# Patient Record
Sex: Female | Born: 1935 | Race: Black or African American
Health system: Southern US, Community
[De-identification: ages and names within clinical notes are randomized; demographics above are authoritative.]

## PROBLEM LIST (undated history)

## (undated) DIAGNOSIS — J302 Other seasonal allergic rhinitis: Secondary | ICD-10-CM

## (undated) DIAGNOSIS — R0902 Hypoxemia: Secondary | ICD-10-CM

## (undated) DIAGNOSIS — K219 Gastro-esophageal reflux disease without esophagitis: Secondary | ICD-10-CM

## (undated) DIAGNOSIS — I739 Peripheral vascular disease, unspecified: Secondary | ICD-10-CM

## (undated) DIAGNOSIS — I2510 Atherosclerotic heart disease of native coronary artery without angina pectoris: Secondary | ICD-10-CM

## (undated) DIAGNOSIS — I219 Acute myocardial infarction, unspecified: Secondary | ICD-10-CM

## (undated) DIAGNOSIS — Z87898 Personal history of other specified conditions: Secondary | ICD-10-CM

## (undated) DIAGNOSIS — E785 Hyperlipidemia, unspecified: Secondary | ICD-10-CM

## (undated) DIAGNOSIS — R0600 Dyspnea, unspecified: Secondary | ICD-10-CM

## (undated) DIAGNOSIS — D649 Anemia, unspecified: Secondary | ICD-10-CM

## (undated) DIAGNOSIS — R918 Other nonspecific abnormal finding of lung field: Secondary | ICD-10-CM

## (undated) DIAGNOSIS — I10 Essential (primary) hypertension: Secondary | ICD-10-CM

## (undated) HISTORY — DX: Hypoxemia: R09.02

## (undated) HISTORY — PX: COLONOSCOPY: SHX174

## (undated) HISTORY — PX: VASCULAR SURGERY: SHX849

## (undated) HISTORY — PX: FOOT SURGERY: SHX648

## (undated) HISTORY — PX: CARDIAC SURGERY: SHX584

## (undated) NOTE — *Deleted (*Deleted)
Transition of Care [facility]) - CM/SW Discharge Note   Patient Details  Name: Tracey Jackson MRN: [id number] Date of Birth: 03-06-1935  Transition [facility] Care [facility]) CM/SW Contact:  Epifanio Lesches, RN Phone Number: [phone] 06/26/2020, 8:57 AM   Clinical Narrative:    Patient will DC to: home Anticipated DC date: 06/26/2020 Family notified: daughter, Luster [facility] Transport by: car  Per MD patient ready for DC today. RN, patient,and patient's daughter notified of DC. Daughter has arranged appointment for establishment of primary care, pt without PCP. DME: rolling walker , BSC will be delivered to bedside prior to d/c. Portable oxygen tank / concentrator will be delivered to bedside prior to d/c.  Pt without Rx med concerns or affordability.  Caprice Renshaw (Daughter)     [phone]       RNCM will sign off for now as intervention is no longer needed. Please consult [place] again if new needs arise.   Final next level of care: Home/Self Care Barriers to Discharge: No Barriers Identified   Patient Goals and CMS Choice Patient states their goals for this hospitalization and ongoing recovery are:: to go home CMS Medicare.gov Compare Post Acute Care list provided to:: Patient Choice offered to / list presented to : Patient  Discharge Placement                       Discharge Plan and Services   Discharge Planning Services: CM Consult Post Acute Care Choice: Durable Medical Equipment          DME Arranged: Oxygen DME Agency: [employer] Date DME Agency Contacted: 06/25/20 Time DME Agency Contacted: 1434 Representative spoke with at DME Agency: Ian Malkin waiting on sat note and MD order [facility] Arranged: NA          Social Determinants of Health (SDOH) Interventions     Readmission Risk Interventions No flowsheet data found.

---

## 2014-02-06 ENCOUNTER — Emergency Department (HOSPITAL_COMMUNITY)
Admission: EM | Admit: 2014-02-06 | Discharge: 2014-02-06 | Disposition: A | Discharge status: Home / Self Care | Payer: Self-pay | Attending: Emergency Medicine | Admitting: Emergency Medicine

## 2014-02-06 ENCOUNTER — Encounter (HOSPITAL_COMMUNITY): Payer: Self-pay | Admitting: Emergency Medicine

## 2014-02-06 DIAGNOSIS — Z7901 Long term (current) use of anticoagulants: Secondary | ICD-10-CM | POA: Insufficient documentation

## 2014-02-06 DIAGNOSIS — R58 Hemorrhage, not elsewhere classified: Secondary | ICD-10-CM

## 2014-02-06 DIAGNOSIS — Z79899 Other long term (current) drug therapy: Secondary | ICD-10-CM | POA: Insufficient documentation

## 2014-02-06 DIAGNOSIS — I10 Essential (primary) hypertension: Secondary | ICD-10-CM | POA: Insufficient documentation

## 2014-02-06 DIAGNOSIS — I2510 Atherosclerotic heart disease of native coronary artery without angina pectoris: Secondary | ICD-10-CM | POA: Insufficient documentation

## 2014-02-06 DIAGNOSIS — F17200 Nicotine dependence, unspecified, uncomplicated: Secondary | ICD-10-CM | POA: Insufficient documentation

## 2014-02-06 DIAGNOSIS — IMO0002 Reserved for concepts with insufficient information to code with codable children: Secondary | ICD-10-CM | POA: Insufficient documentation

## 2014-02-06 DIAGNOSIS — T888XXA Other specified complications of surgical and medical care, not elsewhere classified, initial encounter: Secondary | ICD-10-CM | POA: Insufficient documentation

## 2014-02-06 HISTORY — DX: Essential (primary) hypertension: I10

## 2014-02-06 HISTORY — DX: Atherosclerotic heart disease of native coronary artery without angina pectoris: I25.10

## 2014-02-06 LAB — CBC WITH DIFFERENTIAL/PLATELET
Basophils Absolute: 0 10*3/uL (ref 0.0–0.1)
Basophils Relative: 0 % (ref 0–1)
Eosinophils Absolute: 0.2 10*3/uL (ref 0.0–0.7)
Eosinophils Relative: 3 % (ref 0–5)
HCT: 30.8 % — ABNORMAL LOW (ref 36.0–46.0)
Hemoglobin: 8.9 g/dL — ABNORMAL LOW (ref 12.0–15.0)
Lymphocytes Relative: 19 % (ref 12–46)
Lymphs Abs: 1.4 10*3/uL (ref 0.7–4.0)
MCH: 27.1 pg (ref 26.0–34.0)
MCHC: 28.9 g/dL — ABNORMAL LOW (ref 30.0–36.0)
MCV: 93.6 fL (ref 78.0–100.0)
Monocytes Absolute: 0.8 10*3/uL (ref 0.1–1.0)
Monocytes Relative: 10 % (ref 3–12)
Neutro Abs: 5.3 10*3/uL (ref 1.7–7.7)
Neutrophils Relative %: 68 % (ref 43–77)
Platelets: 412 10*3/uL — ABNORMAL HIGH (ref 150–400)
RBC: 3.29 MIL/uL — ABNORMAL LOW (ref 3.87–5.11)
RDW: 19.9 % — ABNORMAL HIGH (ref 11.5–15.5)
WBC: 7.7 10*3/uL (ref 4.0–10.5)

## 2014-02-06 LAB — PROTIME-INR
INR: 1.07 (ref 0.00–1.49)
Prothrombin Time: 13.7 seconds (ref 11.6–15.2)

## 2014-02-06 MED ORDER — "THROMBI-PAD 3""X3"" EX PADS"
1.0000 | MEDICATED_PAD | Freq: Once | CUTANEOUS | Status: AC
  Administered 2014-02-06: 1 via TOPICAL
  Filled 2014-02-06: qty 1

## 2014-02-06 NOTE — ED Provider Notes (Signed)
CSN: [id number]     Arrival date & time 02/06/14  0424 History   First MD Initiated Contact with Patient 02/06/14 [phone]     Chief Complaint  Patient presents with  . Coagulation Disorder    (Consider location/radiation/quality/duration/timing/severity/associated sxs/prior Treatment) HPI Comments: Patient is a 79 year old female with a history of hypertension and coronary artery disease, on daily Lovenox injections and Plavix, who presents to the emergency department for persistent bleeding. Patient states she gave herself her Lovenox injection at 8 PM yesterday evening and the injection site to her left lower abdomen has been bleeding constantly since this time. Son states that they applied gauze and pressure without relief of symptoms. No aggravating factors identified. Patient denies associated lightheadedness or dizziness, syncope, shortness of breath, abdominal pain, numbness/tingling, and weakness.  The history is provided by the patient and a relative. No language interpreter was used.    Past Medical History  Diagnosis Date  . Hypertension   . Coronary artery disease    Past Surgical History  Procedure Laterality Date  . Cardiac surgery      Stent Placement   No family history on file. History  Substance Use Topics  . Smoking status: Current Every Day Smoker -- 0.25 packs/day    Types: Cigarettes  . Smokeless tobacco: Never Used  . Alcohol Use: No   OB History   Grav Para Term Preterm Abortions TAB SAB Ect Mult Living                  Review of Systems  Skin:       +ecchymosis  Hematological: Bruises/bleeds easily.  All other systems reviewed and are negative.    Allergies  Review of patient's allergies indicates no known allergies.  Home Medications   Prior to Admission medications   Medication Sig Start Date End Date Taking? Authorizing Provider  clopidogrel (PLAVIX) 75 MG tablet Take 75 mg by mouth daily.  01/28/14  Yes Historical Provider, MD  enoxaparin  (LOVENOX) 100 MG/ML injection Inject 100 mg into the skin daily.  02/04/14  Yes Historical Provider, MD  furosemide (LASIX) 20 MG tablet Take 20 mg by mouth daily.  01/12/14  Yes Historical Provider, MD  pantoprazole (PROTONIX) 40 MG tablet Take 40 mg by mouth daily.  12/08/13  Yes Historical Provider, MD   BP 106/72  Pulse 86  Temp(Src) 97.9 F (36.6 C) (Oral)  SpO2 92%  Physical Exam  Nursing note and vitals reviewed. Constitutional: She is oriented to person, place, and time. She appears well-developed and well-nourished. No distress.  Nontoxic/nonseptic appearing. Patient in NAD.  HENT:  Head: Normocephalic and atraumatic.  Eyes: Conjunctivae and EOM are normal. No scleral icterus.  Neck: Normal range of motion.  Cardiovascular: Normal rate, regular rhythm and intact distal pulses.   Distal radial pulse 2+ in RUE.  Pulmonary/Chest: Effort normal. No respiratory distress.  No tachypnea or dyspnea. Chest expansion symmetric.  Abdominal: Soft. She exhibits no shifting dullness, no distension, no pulsatile midline mass and no mass. There is no tenderness. There is no rebound and no guarding.    See skin comments  Musculoskeletal: Normal range of motion.  Neurological: She is alert and oriented to person, place, and time.  Skin: Skin is warm and dry. No rash noted. She is not diaphoretic. No erythema. No pallor.  +Ecchymosis to LLQ of abdomen with central punctate area of bleeding characterized to be slow and weeping. No palpable, pulsatile bleeding. No significant induration. No associated TTP.  Psychiatric: She has a normal mood and affect. Her behavior is normal.    ED Course  Procedures (including critical care time) Labs Review Labs Reviewed  CBC WITH DIFFERENTIAL - Abnormal; Notable for the following:    RBC 3.29 (*)    Hemoglobin 8.9 (*)    HCT 30.8 (*)    MCHC 28.9 (*)    RDW 19.9 (*)    Platelets 412 (*)    All other components within normal limits  PROTIME-INR     Imaging Review No results found.   EKG Interpretation None      MDM   Final diagnoses:  Bleeding  Ecchymosis    Patient is a 79 year old female on chronic anticoagulant who presents to the emergency department for prolonged bleeding from her Lovenox injection site in her left lower abdomen. Patient states that bleeding has been persistent since 8 PM yesterday. On my examination, patient has a area of ecchymosis without induration to her left lower quadrant of her abdomen. Bleeding is minimal, from a central punctate area without palpable, pulsatile bleeding. Thrombi and pressure applied in ED. Patient checked after one hour of applied pressure. Thrombi pad without any evidence of blood. No active bleeding identified.  Patient today with a hemoglobin of 8.9. No old for comparison. Patient, however, has no evidence of acute blood loss. Ecchymosis has not worsened since arrival and patient has remained hemodynamically stable without tachycardia or hypotension. She denies shortness of breath and is without hypoxia. Believe she is stable for discharge at this time with instruction to follow up with her primary care physician on Monday for a repeat CBC. Return precautions provided and discussed with son at bedside. Both are agreeable to plan with no unaddressed concerns.   Filed Vitals:   02/06/14 0437 02/06/14 0445 02/06/14 0500 02/06/14 0515  BP:  125/67 135/79 106/72  Pulse:  95 96 86  Temp: 97.9 F (36.6 C)     TempSrc: Oral     SpO2:  93% 94% 92%     Antonietta Breach, PA-C 02/06/14 [phone]

## 2014-02-06 NOTE — ED Provider Notes (Signed)
Medical screening examination/treatment/procedure(s) were performed by non-physician practitioner and as supervising physician I was immediately available for consultation/collaboration.   EKG Interpretation None       Kalman Drape, MD 02/06/14 [phone]

## 2014-02-06 NOTE — ED Notes (Addendum)
Patient arrives from home via EMS with complaint of bleeding via lovenox injection site on lower left abdomen.

## 2014-02-06 NOTE — Discharge Instructions (Signed)
Anticoagulation, Generic Anticoagulants are medications used to prevent clots from developing in your veins. These medications are also known as blood thinners. If blood clots are untreated, they could travel to your lungs. This is called a pulmonary embolus. A blood clot in your lungs can be fatal.  Caregivers often use anticoagulants to prevent clots following surgery. Anticoagulants are also used along with aspirin when the heart is not getting enough blood. Another anticoagulant called warfarin is started 2 to 3 days after a rapid-acting injectable anticoagulant is started. The rapid-acting anticoagulants are usually continued until warfarin has begun to work. Your caregiver will judge this length of time by blood tests known as the prothrombin time (PT) and International Normalization Ratio (INR). This means that your blood is at the necessary and best level to prevent clots. RISKS AND COMPLICATIONS  If you have received recent epidural anesthesia, spinal anesthesia, or a spinal tap while receiving anticoagulants, you are at risk for developing a blood clot in or around the spine. This condition could result in long-term or permanent paralysis.  Because anticoagulants thin your blood, severe bleeding may occur from any tissue or organ. Symptoms of the blood being too thin may include:  Bleeding from the nose or gums that does not stop quickly.  Unusual bruising or bruising easily.  Swelling or pain at an injection site.  A cut that does not stop bleeding within 10 minutes.  Continual nausea for more than 1 day or vomiting blood.  Coughing up blood.  Blood in the urine which may appear as pink, red, or brown urine.  Blood in bowel movements which may appear as red, dark or black stools.  Sudden weakness or numbness of the face, arm, or leg, especially on one side of the body.  Sudden confusion.  Trouble speaking (aphasia) or understanding.  Sudden trouble seeing in one or both  eyes.  Sudden trouble walking.  Dizziness.  Loss of balance or coordination.  Severe pain, such as a headache, joint pain, or back pain.  Fever.  Too little anticoagulation continues to allow the risk for blood clots. HOME CARE INSTRUCTIONS   Due to the complications of anticoagulants, it is very important that you take your anticoagulant as directed by your caregiver. Anticoagulants need to be taken exactly as instructed. Be sure you understand all your anticoagulant instructions.  Warfarin. Your caregiver will advise you on the length of treatment (usually 3 6 months, sometimes lifelong).  Take warfarin exactly as directed by your caregiver. It is recommended that you take your warfarin dose at the same time of the day. It is preferred that you take warfarin in the late afternoon. If you have been told to stop taking warfarin, do not resume taking warfarin until directed to do so by your caregiver. Follow your caregiver's instructions if you accidentally take an extra dose or miss a dose of warfarin. It is very important to take warfarin as directed since bleeding or blood clots could result in chronic or permanent injury, pain, or disability.  Too much and too little warfarin are both dangerous. Too much warfarin increases the risk of bleeding. Too little warfarin continues to allow the risk for blood clots. While taking warfarin, you will need to have regular blood tests to measure your blood clotting time. These blood tests usually include both the PT and INR tests. The PT and INR results allow your caregiver to adjust your dose of warfarin. The dose can change for many reasons. It is critically   important that you take warfarin exactly as prescribed, and that you have your PT and INR levels drawn exactly as directed. Follow up with your laboratory test appointments as directed. It is very important to keep your lab appointments. Not keeping lab appointments could result in a chronic or  permanent injury, pain, or disability.  Many foods, especially foods high in vitamin K can interfere with warfarin and affect the PT and INR results. Foods high in vitamin K include spinach, kale, broccoli, cabbage, collard and turnip greens, brussels sprouts, peas, cauliflower, seaweed, and parsley as well as beef and pork liver, green tea, and soybean oil. You should eat a consistent amount of foods high in vitamin K. Avoid major changes in your diet, or notify your caregiver before changing your diet. Arrange a visit with a dietitian to answer your questions.  Many medicines can interfere with warfarin and affect the PT and INR results. You must tell your caregiver about any and all medicines you take, this includes all vitamins and supplements. Ask your caregiver before taking these. Prescription and over-the-counter medicine consistency is critical to warfarin management. It is important that potential interactions are checked before you start a new medicine. Be especially cautious with aspirin and anti-inflammatory medicines. Ask your caregiver before taking these. Medicines such as antibiotics and acid-reducing medicine can interact with warfarin and can cause an increased warfarin effect. Warfarin can also interfere with the effectiveness of medicines you are taking. Do not take or discontinue any prescribed or over-the-counter medicine except on the advice of your caregiver or pharmacist.  Some vitamins, supplements, and herbal products interfere with the effectiveness of warfarin. Vitamin E may increase the anticoagulant effects of warfarin. Vitamin K may can cause warfarin to be less effective. Do not take or discontinue any vitamin, supplement, or herbal product except on the advice of your caregiver or pharmacist.  Alcohol can change the body's ability to handle warfarin. It is best to avoid alcoholic drinks or consume only very small amounts while taking warfarin. Notify your caregiver if you  change your alcohol intake. A sudden increase in alcohol use can increase your risk of bleeding. Chronic alcohol use can cause warfarin to be less effective.  If you have a loss of appetite or get the stomach flu (viral gastroenteritis), talk to your caregiver as soon as possible. A decrease in your normal vitamin K intake can make you more sensitive to your usual dose of warfarin.  Some medical conditions may increase your risk for bleeding while you are taking warfarin. A fever, diarrhea lasting more than a day, worsening heart failure, or worsening liver function are some medical conditions that could affect warfarin. Contact your caregiver if you have any of these medical conditions.  Warfarin can have side effects, such as excessive bruising or bleeding. You will need to hold pressure over cuts for longer than usual.  Be careful not to cut yourself when using sharp objects.  Notify your dentist or other caregivers before procedures.  Limit physical activities or sports that could result in a fall or cause injury. Avoid contact sports.  Wear a medical alert bracelet or carry a medical alert card. SEEK MEDICAL CARE IF:   You develop any rashes.  You have any worsening of the condition for which you are receiving anticoagulation therapy. SEEK IMMEDIATE MEDICAL CARE IF:   Bleeding from the nose or gums does not stop quickly.  You have unusual bruising or are bruising easily.  Swelling or pain occurs   at an injection site.  A cut does not stop bleeding within 10 minutes.  You have continual nausea for more than 1 day or are vomiting blood.  You are coughing up blood.  You have blood in the urine.  You have dark or black stools.  You have sudden weakness or numbness of the face, arm, or leg, especially on one side of the body.  You have sudden confusion.  You have trouble speaking (aphasia) or understanding.  You have sudden trouble seeing in one or both eyes.  You have  sudden trouble walking.  You have dizziness.  You have a loss of balance or coordination.  You have severe pain, such as a headache, joint pain, or back pain.  You have a serious fall or head injury, even if you are not bleeding.  You have an oral temperature above 102 F (38.9 C), not controlled by medicine. ANY OF THESE SYMPTOMS MAY REPRESENT A SERIOUS PROBLEM THAT IS AN EMERGENCY. Do not wait to see if the symptoms will go away. Get medical help right away. Call your local emergency services (911 in [place] DO NOT drive yourself to the hospital. MAKE SURE YOU:   Understand these instructions.  Will watch your condition.  Will get help right away if you are not doing well or get worse. Document Released: 10/02/2005 Document Revised: 06/26/2012 Document Reviewed: 05/06/2008 [employer] Patient Information 2014 [employer].  

## 2014-02-06 NOTE — ED Notes (Signed)
Family explains that 2x2 guaze dressing has been changed twice since bleeding started.

## 2018-03-13 LAB — HM DEXA SCAN

## 2018-03-26 LAB — HM MAMMOGRAPHY: HM Mammogram: ABNORMAL — AB (ref 0–4)

## 2020-01-01 ENCOUNTER — Other Ambulatory Visit: Payer: Self-pay

## 2020-01-01 ENCOUNTER — Inpatient Hospital Stay
Admission: EM | Admit: 2020-01-01 | Discharge: 2020-01-03 | DRG: 151 | Disposition: A | Discharge status: Home / Self Care | Payer: Medicare Other | Attending: Internal Medicine | Admitting: Internal Medicine

## 2020-01-01 DIAGNOSIS — D62 Acute posthemorrhagic anemia: Secondary | ICD-10-CM | POA: Diagnosis present

## 2020-01-01 DIAGNOSIS — Z7982 Long term (current) use of aspirin: Secondary | ICD-10-CM

## 2020-01-01 DIAGNOSIS — Z955 Presence of coronary angioplasty implant and graft: Secondary | ICD-10-CM

## 2020-01-01 DIAGNOSIS — I10 Essential (primary) hypertension: Secondary | ICD-10-CM | POA: Diagnosis present

## 2020-01-01 DIAGNOSIS — R040 Epistaxis: Secondary | ICD-10-CM | POA: Diagnosis not present

## 2020-01-01 DIAGNOSIS — Z20822 Contact with and (suspected) exposure to covid-19: Secondary | ICD-10-CM | POA: Diagnosis present

## 2020-01-01 DIAGNOSIS — Z87891 Personal history of nicotine dependence: Secondary | ICD-10-CM

## 2020-01-01 DIAGNOSIS — E876 Hypokalemia: Secondary | ICD-10-CM

## 2020-01-01 DIAGNOSIS — I739 Peripheral vascular disease, unspecified: Secondary | ICD-10-CM | POA: Diagnosis present

## 2020-01-01 DIAGNOSIS — Z79899 Other long term (current) drug therapy: Secondary | ICD-10-CM

## 2020-01-01 DIAGNOSIS — F17210 Nicotine dependence, cigarettes, uncomplicated: Secondary | ICD-10-CM | POA: Diagnosis present

## 2020-01-01 DIAGNOSIS — Z7902 Long term (current) use of antithrombotics/antiplatelets: Secondary | ICD-10-CM

## 2020-01-01 DIAGNOSIS — D509 Iron deficiency anemia, unspecified: Secondary | ICD-10-CM | POA: Diagnosis present

## 2020-01-01 DIAGNOSIS — I2510 Atherosclerotic heart disease of native coronary artery without angina pectoris: Secondary | ICD-10-CM | POA: Diagnosis present

## 2020-01-01 DIAGNOSIS — I252 Old myocardial infarction: Secondary | ICD-10-CM

## 2020-01-01 DIAGNOSIS — D649 Anemia, unspecified: Secondary | ICD-10-CM

## 2020-01-01 LAB — CBC WITH DIFFERENTIAL/PLATELET
Abs Immature Granulocytes: 0.04 10*3/uL (ref 0.00–0.07)
Basophils Absolute: 0 10*3/uL (ref 0.0–0.1)
Basophils Relative: 0 %
Eosinophils Absolute: 0.1 10*3/uL (ref 0.0–0.5)
Eosinophils Relative: 1 %
HCT: 27.6 % — ABNORMAL LOW (ref 36.0–46.0)
Hemoglobin: 7.8 g/dL — ABNORMAL LOW (ref 12.0–15.0)
Immature Granulocytes: 1 %
Lymphocytes Relative: 11 %
Lymphs Abs: 0.8 10*3/uL (ref 0.7–4.0)
MCH: 29 pg (ref 26.0–34.0)
MCHC: 28.3 g/dL — ABNORMAL LOW (ref 30.0–36.0)
MCV: 102.6 fL — ABNORMAL HIGH (ref 80.0–100.0)
Monocytes Absolute: 0.7 10*3/uL (ref 0.1–1.0)
Monocytes Relative: 9 %
Neutro Abs: 6.1 10*3/uL (ref 1.7–7.7)
Neutrophils Relative %: 78 %
Platelets: 426 10*3/uL — ABNORMAL HIGH (ref 150–400)
RBC: 2.69 MIL/uL — ABNORMAL LOW (ref 3.87–5.11)
RDW: 25.4 % — ABNORMAL HIGH (ref 11.5–15.5)
WBC: 7.8 10*3/uL (ref 4.0–10.5)
nRBC: 0.5 % — ABNORMAL HIGH (ref 0.0–0.2)

## 2020-01-01 LAB — BASIC METABOLIC PANEL
Anion gap: 9 (ref 5–15)
BUN: 22 mg/dL (ref 8–23)
CO2: 22 mmol/L (ref 22–32)
Calcium: 8.4 mg/dL — ABNORMAL LOW (ref 8.9–10.3)
Chloride: 110 mmol/L (ref 98–111)
Creatinine, Ser: 1.04 mg/dL — ABNORMAL HIGH (ref 0.44–1.00)
GFR calc Af Amer: 58 mL/min — ABNORMAL LOW (ref >60)
GFR calc non Af Amer: 50 mL/min — ABNORMAL LOW (ref >60)
Glucose, Bld: 143 mg/dL — ABNORMAL HIGH (ref 70–99)
Potassium: 2.9 mmol/L — ABNORMAL LOW (ref 3.5–5.1)
Sodium: 141 mmol/L (ref 135–145)

## 2020-01-01 LAB — PROTIME-INR
INR: 1.1 (ref 0.8–1.2)
Prothrombin Time: 14 seconds (ref 11.4–15.2)

## 2020-01-01 LAB — APTT: aPTT: 29 seconds (ref 24–36)

## 2020-01-01 MED ORDER — POTASSIUM CHLORIDE CRYS ER 20 MEQ PO TBCR
40.0000 meq | EXTENDED_RELEASE_TABLET | Freq: Once | ORAL | Status: DC
  Filled 2020-01-01: qty 2

## 2020-01-01 MED ORDER — CEPHALEXIN 250 MG PO CAPS
250.0000 mg | ORAL_CAPSULE | Freq: Once | ORAL | Status: AC
  Administered 2020-01-02: 250 mg via ORAL
  Filled 2020-01-01: qty 1

## 2020-01-01 NOTE — ED Notes (Signed)
Pt resting on stretcher with daughter at the bedside. Nose continues to drip small amount with packing in place. Denies pain.

## 2020-01-01 NOTE — ED Notes (Signed)
Pt blew her nose twice in triage and removed several clots. Clamp reapplied. Slow drip from the left nostril only. Pt is alert and oriented.

## 2020-01-01 NOTE — ED Notes (Addendum)
See triage note. Pt in with nose bleed. States has been on blood thinners for a long time and has never had issues with bleeding like this. Nose is still clamped from triage but continues to ooze blood slowly.

## 2020-01-01 NOTE — ED Notes (Signed)
Pt up to bedside toilet. Family at bedside.

## 2020-01-01 NOTE — ED Notes (Signed)
Pt bleeding more from nose. Clamp remains in place. Pt given new facial clothes. EDP Stafford notified in person. No new orders.

## 2020-01-01 NOTE — ED Notes (Signed)
Attempted butterfly for blood at R [facility] but vein blew right before collection.

## 2020-01-01 NOTE — ED Provider Notes (Signed)
[facility] Emergency Department Provider Note   ____________________________________________   First MD Initiated Contact with Patient 01/01/20 2306     (approximate)  I have reviewed the triage vital signs and the nursing notes.   HISTORY  Chief Complaint Epistaxis    HPI Tracey Jackson is a 85 y.o. female presents to the ED from home with a chief complaint of left-sided nosebleed.  Patient has a history of prior nosebleeds not requiring cauterization.  She is on Plavix.  States she has been having "sinuses" this week and blowing her nose often.  Began to bleed around 7:30 PM.  Denies chest pain, shortness of breath, nausea/vomiting, dizziness or lightheadedness.       Past Medical History:  Diagnosis Date  . Coronary artery disease   . Hypertension     Patient Active Problem List   Diagnosis Date Noted  . Epistaxis 01/02/2020  . Essential hypertension 01/02/2020  . History of MI (myocardial infarction) 01/02/2020  . Acute blood loss anemia 01/02/2020    Past Surgical History:  Procedure Laterality Date  . CARDIAC SURGERY     Stent Placement    Prior to Admission medications   Medication Sig Start Date End Date Taking? Authorizing Provider  amLODipine (NORVASC) 5 MG tablet Take 5 mg by mouth daily.   Yes [provider]  aspirin EC 81 MG tablet Take 81 mg by mouth daily.   Yes [provider]  atorvastatin (LIPITOR) 10 MG tablet Take 10 mg by mouth at bedtime.   Yes [provider]  clopidogrel (PLAVIX) 75 MG tablet Take 75 mg by mouth daily.  01/28/14  Yes [provider]  ferrous sulfate 325 (65 FE) MG tablet Take 325 mg by mouth daily with breakfast.   Yes [provider]  folic acid (FOLVITE) 1 MG tablet Take 1 mg by mouth daily.   Yes [provider]  furosemide (LASIX) 20 MG tablet Take 20 mg by mouth daily.  01/12/14  Yes [provider]  pantoprazole (PROTONIX) 40 MG  tablet Take 40 mg by mouth daily.  12/08/13  Yes [provider]    Allergies Patient has no known allergies.  No family history on file.  Social History Social History   Tobacco Use  . Smoking status: Current Every Day Smoker    Packs/day: 0.25    Types: Cigarettes  . Smokeless tobacco: Never Used  Substance Use Topics  . Alcohol use: No  . Drug use: No    Review of Systems  Constitutional: No fever/chills Eyes: No visual changes. ENT: Positive for left nosebleed.  No sore throat. Cardiovascular: Denies chest pain. Respiratory: Denies shortness of breath. Gastrointestinal: No abdominal pain.  No nausea, no vomiting.  No diarrhea.  No constipation. Genitourinary: Negative for dysuria. Musculoskeletal: Negative for back pain. Skin: Negative for rash. Neurological: Negative for headaches, focal weakness or numbness.   ____________________________________________   PHYSICAL EXAM:  VITAL SIGNS: ED Triage Vitals  Enc Vitals Group     BP 01/01/20 2141 (!) 148/102     Pulse Rate 01/01/20 2141 (!) 102     Resp 01/01/20 2141 20     Temp 01/01/20 2141 (!) 96.7 F (35.9 C)     Temp Source 01/01/20 2141 Axillary     SpO2 01/01/20 2141 99 %     Weight 01/01/20 2142 127 lb (57.6 kg)     Height 01/01/20 2142 5\' 3"  (1.6 m)     Head Circumference --  Peak Flow --      Pain Score 01/01/20 2141 0     Pain Loc --      Pain Edu? --      Excl. in [place]? --     Constitutional: Alert and oriented.  Elderly appearing and in no acute distress. Eyes: Conjunctivae are normal. PERRL. EOMI. Head: Atraumatic. Nose: Bleeding from left anterior nares.. Mouth/Throat: Mucous membranes are moist.  No bleeding down posterior oropharynx. Neck: No stridor.   Cardiovascular: Normal rate, regular rhythm. Grossly normal heart sounds.  Good peripheral circulation. Respiratory: Normal respiratory effort.  No retractions. Lungs CTAB. Gastrointestinal: Soft and nontender. No  distention. No abdominal bruits. No CVA tenderness. Musculoskeletal: No lower extremity tenderness nor edema.  No joint effusions. Neurologic:  Normal speech and language. No gross focal neurologic deficits are appreciated.  Skin:  Skin is warm, dry and intact. No rash noted. Psychiatric: Mood and affect are normal. Speech and behavior are normal.  ____________________________________________   LABS (all labs ordered are listed, but only abnormal results are displayed)  Labs Reviewed  BASIC METABOLIC PANEL - Abnormal; Notable for the following components:      Result Value   Potassium 2.9 (*)    Glucose, Bld 143 (*)    Creatinine, Ser 1.04 (*)    Calcium 8.4 (*)    GFR calc non Af Amer 50 (*)    GFR calc Af Amer 58 (*)    All other components within normal limits  CBC WITH DIFFERENTIAL/PLATELET - Abnormal; Notable for the following components:   RBC 2.69 (*)    Hemoglobin 7.8 (*)    HCT 27.6 (*)    MCV 102.6 (*)    MCHC 28.3 (*)    RDW 25.4 (*)    Platelets 426 (*)    nRBC 0.5 (*)    All other components within normal limits  RESPIRATORY PANEL BY RT PCR (FLU A&B, COVID)  PROTIME-INR  APTT  CBC   ____________________________________________  EKG  None ____________________________________________  RADIOLOGY  ED MD interpretation: None  Official radiology report(s): No results found.  ____________________________________________   PROCEDURES  Procedure(s) performed (including Critical Care):  .Epistaxis Management  Date/Time: 01/01/2020 11:20 PM Performed by: Paulette Blanch, MD Authorized by: Paulette Blanch, MD   Consent:    Consent obtained:  Verbal   Consent given by:  Patient   Risks discussed:  Bleeding, infection, pain and nasal injury   Alternatives discussed:  No treatment Anesthesia (see MAR for exact dosages):    Anesthesia method:  Topical application   Topical anesthetic:  Lidocaine gel Procedure details:    Treatment site:  L anterior    Treatment method:  Merocel sponge   Treatment complexity:  Limited   Treatment episode: initial   .Epistaxis Management  Date/Time: 01/02/2020 12:15 AM Performed by: Paulette Blanch, MD Authorized by: Paulette Blanch, MD   Consent:    Consent obtained:  Verbal   Consent given by:  Patient   Risks discussed:  Bleeding, infection, nasal injury and pain Anesthesia (see MAR for exact dosages):    Anesthesia method:  Topical application Procedure details:    Treatment site:  L anterior   Treatment method:  Nasal balloon (Rhinorocket 7.5cm)   Treatment episode: recurring     CRITICAL CARE Performed by: Paulette Blanch   Total critical care time: 45 minutes  Critical care time was exclusive of separately billable procedures and treating other patients.  Critical care was necessary to  treat or prevent imminent or life-threatening deterioration.  Critical care was time spent personally by me on the following activities: development of treatment plan with patient and/or surrogate as well as nursing, discussions with consultants, evaluation of patient's response to treatment, examination of patient, obtaining history from patient or surrogate, ordering and performing treatments and interventions, ordering and review of laboratory studies, ordering and review of radiographic studies, pulse oximetry and re-evaluation of patient's condition.   ____________________________________________   INITIAL IMPRESSION / ASSESSMENT AND PLAN / ED COURSE  As part of my medical decision making, I reviewed the following data within the [place] History obtained from family, Nursing notes reviewed and incorporated, Labs reviewed and Notes from prior ED visits     Sheran Newstrom was evaluated in Emergency Department on 01/02/2020 for the symptoms described in the history of present illness. She was evaluated in the context of the global COVID-19 pandemic, which necessitated consideration that the  patient might be at risk for infection with the SARS-CoV-2 virus that causes COVID-19. Institutional protocols and algorithms that pertain to the evaluation of patients at risk for COVID-19 are in a state of rapid change based on information released by regulatory bodies including the CDC and federal and state organizations. These policies and algorithms were followed during the patient's care in the ED.    85 year old female on Plavix who presents with left epistaxis.  Differential diagnosis includes but is not limited to anterior versus posterior epistaxis, trauma, etc.  Mericel packing applied.  Patient tolerated well.  Will obtain basic lab work and observe.   Clinical Course as of Jan 01 350  Fri Jan 02, 2020  0017 Bleeding through [place].  Repacked with anterior Rhino Rocket 7.5 cm   [JS]  0059 Discussed with hospital services for observation admission observe for rebleed and monitor H/H.   [JS]  0130 Small trickle is seen from left nostril.  Spoke with ENT Dr. Pryor Ochoa who agrees with putting more air in the balloon, TXA if nose continues to trickle.  If all else fails, he recommends packing the other side.   [JS]  1103 Putting more air in the balloon was successful to stop a small trickle of blood.  Patient did not require TXA which remains at her bedside should she need it.   [JS]    Clinical Course User Index [JS] Paulette Blanch, MD     ____________________________________________   FINAL CLINICAL IMPRESSION(S) / ED DIAGNOSES  Final diagnoses:  Left-sided epistaxis  Hypokalemia  Anemia, unspecified type     ED Discharge Orders    None       Note:  This document was prepared using Dragon voice recognition software and may include unintentional dictation errors.   Paulette Blanch, MD 01/02/20 [phone]

## 2020-01-01 NOTE — ED Triage Notes (Signed)
Pt to the er for epitaxis x 1 hour. Pt has a hx of nosebleeds. Pt is on plavix. Pt clamped in triage. Pt states it feels like it is slowing down.

## 2020-01-02 ENCOUNTER — Encounter: Payer: Self-pay | Admitting: Internal Medicine

## 2020-01-02 DIAGNOSIS — Z955 Presence of coronary angioplasty implant and graft: Secondary | ICD-10-CM | POA: Diagnosis not present

## 2020-01-02 DIAGNOSIS — D62 Acute posthemorrhagic anemia: Secondary | ICD-10-CM

## 2020-01-02 DIAGNOSIS — Z79899 Other long term (current) drug therapy: Secondary | ICD-10-CM | POA: Diagnosis not present

## 2020-01-02 DIAGNOSIS — I252 Old myocardial infarction: Secondary | ICD-10-CM | POA: Diagnosis not present

## 2020-01-02 DIAGNOSIS — Z87891 Personal history of nicotine dependence: Secondary | ICD-10-CM | POA: Diagnosis not present

## 2020-01-02 DIAGNOSIS — R040 Epistaxis: Principal | ICD-10-CM

## 2020-01-02 DIAGNOSIS — F17210 Nicotine dependence, cigarettes, uncomplicated: Secondary | ICD-10-CM | POA: Diagnosis present

## 2020-01-02 DIAGNOSIS — I10 Essential (primary) hypertension: Secondary | ICD-10-CM

## 2020-01-02 DIAGNOSIS — Z20822 Contact with and (suspected) exposure to covid-19: Secondary | ICD-10-CM | POA: Diagnosis present

## 2020-01-02 DIAGNOSIS — I739 Peripheral vascular disease, unspecified: Secondary | ICD-10-CM | POA: Diagnosis present

## 2020-01-02 DIAGNOSIS — I2510 Atherosclerotic heart disease of native coronary artery without angina pectoris: Secondary | ICD-10-CM | POA: Diagnosis present

## 2020-01-02 DIAGNOSIS — Z7902 Long term (current) use of antithrombotics/antiplatelets: Secondary | ICD-10-CM | POA: Diagnosis not present

## 2020-01-02 DIAGNOSIS — E876 Hypokalemia: Secondary | ICD-10-CM | POA: Diagnosis present

## 2020-01-02 DIAGNOSIS — D509 Iron deficiency anemia, unspecified: Secondary | ICD-10-CM | POA: Diagnosis present

## 2020-01-02 DIAGNOSIS — Z7982 Long term (current) use of aspirin: Secondary | ICD-10-CM | POA: Diagnosis not present

## 2020-01-02 LAB — MAGNESIUM: Magnesium: 1.9 mg/dL (ref 1.7–2.4)

## 2020-01-02 LAB — BASIC METABOLIC PANEL
Anion gap: 9 (ref 5–15)
BUN: 21 mg/dL (ref 8–23)
CO2: 24 mmol/L (ref 22–32)
Calcium: 8.1 mg/dL — ABNORMAL LOW (ref 8.9–10.3)
Chloride: 110 mmol/L (ref 98–111)
Creatinine, Ser: 1.07 mg/dL — ABNORMAL HIGH (ref 0.44–1.00)
GFR calc Af Amer: 56 mL/min — ABNORMAL LOW (ref >60)
GFR calc non Af Amer: 48 mL/min — ABNORMAL LOW (ref >60)
Glucose, Bld: 135 mg/dL — ABNORMAL HIGH (ref 70–99)
Potassium: 3.5 mmol/L (ref 3.5–5.1)
Sodium: 143 mmol/L (ref 135–145)

## 2020-01-02 LAB — IRON AND TIBC
Iron: 14 ug/dL — ABNORMAL LOW (ref 28–170)
Saturation Ratios: 5 % — ABNORMAL LOW (ref 10.4–31.8)
TIBC: 262 ug/dL (ref 250–450)
UIBC: 248 ug/dL

## 2020-01-02 LAB — VITAMIN B12: Vitamin B-12: 323 pg/mL (ref 180–914)

## 2020-01-02 LAB — CBC
HCT: 24.4 % — ABNORMAL LOW (ref 36.0–46.0)
Hemoglobin: 7.2 g/dL — ABNORMAL LOW (ref 12.0–15.0)
MCH: 29.4 pg (ref 26.0–34.0)
MCHC: 29.5 g/dL — ABNORMAL LOW (ref 30.0–36.0)
MCV: 99.6 fL (ref 80.0–100.0)
Platelets: 345 10*3/uL (ref 150–400)
RBC: 2.45 MIL/uL — ABNORMAL LOW (ref 3.87–5.11)
RDW: 25.7 % — ABNORMAL HIGH (ref 11.5–15.5)
WBC: 7.5 10*3/uL (ref 4.0–10.5)
nRBC: 0.4 % — ABNORMAL HIGH (ref 0.0–0.2)

## 2020-01-02 LAB — PREPARE RBC (CROSSMATCH)

## 2020-01-02 LAB — RESPIRATORY PANEL BY RT PCR (FLU A&B, COVID)
Influenza A by PCR: NEGATIVE
Influenza B by PCR: NEGATIVE
SARS Coronavirus 2 by RT PCR: NEGATIVE

## 2020-01-02 LAB — FERRITIN: Ferritin: 21 ng/mL (ref 11–307)

## 2020-01-02 LAB — PHOSPHORUS: Phosphorus: 1.9 mg/dL — ABNORMAL LOW (ref 2.5–4.6)

## 2020-01-02 LAB — HEMOGLOBIN AND HEMATOCRIT, BLOOD
HCT: 23.7 % — ABNORMAL LOW (ref 36.0–46.0)
Hemoglobin: 6.7 g/dL — ABNORMAL LOW (ref 12.0–15.0)

## 2020-01-02 LAB — ABO/RH: ABO/RH(D): A POS

## 2020-01-02 MED ORDER — ATORVASTATIN CALCIUM 20 MG PO TABS
10.0000 mg | ORAL_TABLET | Freq: Every day | ORAL | Status: DC
  Administered 2020-01-03: 10 mg via ORAL
  Filled 2020-01-02 (×2): qty 1

## 2020-01-02 MED ORDER — SODIUM CHLORIDE 0.9 % IV SOLN: INTRAVENOUS | Status: DC

## 2020-01-02 MED ORDER — PANTOPRAZOLE SODIUM 40 MG PO TBEC
40.0000 mg | DELAYED_RELEASE_TABLET | Freq: Every day | ORAL | Status: DC
  Administered 2020-01-02 – 2020-01-03 (×2): 40 mg via ORAL
  Filled 2020-01-02 (×2): qty 1

## 2020-01-02 MED ORDER — FUROSEMIDE 20 MG PO TABS
20.0000 mg | ORAL_TABLET | Freq: Every day | ORAL | Status: DC
  Administered 2020-01-03: 09:00:00 20 mg via ORAL
  Filled 2020-01-02: qty 1

## 2020-01-02 MED ORDER — ONDANSETRON HCL 4 MG PO TABS: 4.0000 mg | ORAL_TABLET | Freq: Four times a day (QID) | ORAL | Status: DC | PRN

## 2020-01-02 MED ORDER — ONDANSETRON HCL 4 MG/2ML IJ SOLN: 4.0000 mg | Freq: Four times a day (QID) | INTRAMUSCULAR | Status: DC | PRN

## 2020-01-02 MED ORDER — FOLIC ACID 1 MG PO TABS
1.0000 mg | ORAL_TABLET | Freq: Every day | ORAL | Status: DC
  Administered 2020-01-02 – 2020-01-03 (×2): 1 mg via ORAL
  Filled 2020-01-02 (×2): qty 1

## 2020-01-02 MED ORDER — AMLODIPINE BESYLATE 5 MG PO TABS
5.0000 mg | ORAL_TABLET | Freq: Every day | ORAL | Status: DC
  Administered 2020-01-02 – 2020-01-03 (×2): 5 mg via ORAL
  Filled 2020-01-02 (×2): qty 1

## 2020-01-02 MED ORDER — FERROUS SULFATE 325 (65 FE) MG PO TABS
325.0000 mg | ORAL_TABLET | Freq: Every day | ORAL | Status: DC
  Administered 2020-01-03: 09:00:00 325 mg via ORAL
  Filled 2020-01-02 (×2): qty 1

## 2020-01-02 MED ORDER — SODIUM CHLORIDE 0.9% IV SOLUTION
Freq: Once | INTRAVENOUS | Status: AC
  Filled 2020-01-02: qty 250

## 2020-01-02 MED ORDER — POTASSIUM CHLORIDE 20 MEQ PO PACK
40.0000 meq | PACK | Freq: Once | ORAL | Status: AC
  Administered 2020-01-02: 40 meq via ORAL
  Filled 2020-01-02: qty 2

## 2020-01-02 MED ORDER — ACETAMINOPHEN 650 MG RE SUPP: 650.0000 mg | Freq: Four times a day (QID) | RECTAL | Status: DC | PRN

## 2020-01-02 MED ORDER — CEPHALEXIN 500 MG PO CAPS
500.0000 mg | ORAL_CAPSULE | Freq: Three times a day (TID) | ORAL | Status: DC
  Administered 2020-01-02 – 2020-01-03 (×2): 500 mg via ORAL
  Filled 2020-01-02 (×2): qty 1

## 2020-01-02 MED ORDER — TRANEXAMIC ACID 1000 MG/10ML IV SOLN
500.0000 mg | Freq: Once | INTRAVENOUS | Status: DC
  Filled 2020-01-02: qty 10

## 2020-01-02 MED ORDER — ACETAMINOPHEN 325 MG PO TABS: 650.0000 mg | ORAL_TABLET | Freq: Four times a day (QID) | ORAL | Status: DC | PRN

## 2020-01-02 NOTE — ED Notes (Addendum)
Pt changed into gown, cardiac monitor initiated, pt assisted to bedside commode, and TV remote provided. Blood consent signed and at bedside.

## 2020-01-02 NOTE — ED Notes (Signed)
Pt states she "feels ok" post transfusion. AOX4, NAD noted, on room air.

## 2020-01-02 NOTE — ED Notes (Signed)
Attempted to place 22 gauge IV in left and right hands. Both veins were blown with saline push. Pt tolerated well.

## 2020-01-02 NOTE — ED Notes (Signed)
Lab contacted to collect 5:00 CBC

## 2020-01-02 NOTE — ED Notes (Signed)
Messaged MD [place] to request repeat hemoglobin for post-transfusion reevaluation.

## 2020-01-02 NOTE — ED Notes (Signed)
IV team at the bedside for IV placement. Pt alert and calm. Nose does not appear to be bleeding currently. Will continue to assess.

## 2020-01-02 NOTE — ED Notes (Signed)
Pt assisted to bedside commode. Chux replaced.

## 2020-01-02 NOTE — ED Notes (Signed)
Dr. Beather Arbour at the bedside to pack pt nose. Pt tolerated well. Family at the bedside.

## 2020-01-02 NOTE — ED Notes (Signed)
Dr. Beather Arbour at the bedside to re-pack nose. Pt tolerated well. Provided for comfort and safety. Will continue to assess.

## 2020-01-02 NOTE — ED Notes (Signed)
Encouraged pt to take PO medications that have been ordered. Pt refused at this time and reports that she may have difficulty swallowing pills with her nose bleeding. Told pt we will wait until bleeding is controlled.

## 2020-01-02 NOTE — H&P (Signed)
History and Physical    [name] NAT:[id number] DOB: 1935/02/03 DOA: 01/01/2020  PCP: System, Pcp Not In   Patient coming from: home I have personally briefly reviewed patient's old medical records in [place]  Chief Complaint: nosebleed  HPI: Tracey Jackson is a 85 y.o. female with medical history significant for hypertension and remote history of MI on both aspirin and Plavix who presents to the emergency room with a nosebleed from the left nostril not responding to usual conservative measures.  She does have a history of nosebleeds.  Says she was having sinus congestion during the week and was blowing her nose a lot.  Bleeding had been ongoing for about 3 hours prior to arriving in the emergency room.  She denied chest pain shortness of breath, palpitations, dizziness, lightheadedness nausea vomiting  ED Course: On arrival in the emergency room BP was slightly elevated at 148/102 with heart rate 87.  Hemoglobin was 7.8.  No recent hemoglobin on file.  Potassium was 2.9.  The emergency room provider tried several measures for anterior packing and she eventually got controlled with Rhino Rocket and administration of TXA.  Dr. Beather Arbour spoke with ENT provider who recommended increasing air in rhino rocket if additional bleeding and packing other nostril if necessary  review of Systems: As per HPI otherwise 10 point review of systems negative.    Past Medical History:  Diagnosis Date  . Coronary artery disease   . Hypertension     Past Surgical History:  Procedure Laterality Date  . CARDIAC SURGERY     Stent Placement     reports that she has been smoking cigarettes. She has been smoking about 0.25 packs per day. She has never used smokeless tobacco. She reports that she does not drink alcohol or use drugs.  No Known Allergies  No family history on file.   Prior to Admission medications   Medication Sig Start Date End Date Taking? Authorizing Provider  amLODipine (NORVASC) 5  MG tablet Take 5 mg by mouth daily.   Yes [provider]  aspirin EC 81 MG tablet Take 81 mg by mouth daily.   Yes [provider]  atorvastatin (LIPITOR) 10 MG tablet Take 10 mg by mouth at bedtime.   Yes [provider]  clopidogrel (PLAVIX) 75 MG tablet Take 75 mg by mouth daily.  01/28/14  Yes [provider]  ferrous sulfate 325 (65 FE) MG tablet Take 325 mg by mouth daily with breakfast.   Yes [provider]  folic acid (FOLVITE) 1 MG tablet Take 1 mg by mouth daily.   Yes [provider]  furosemide (LASIX) 20 MG tablet Take 20 mg by mouth daily.  01/12/14  Yes [provider]  pantoprazole (PROTONIX) 40 MG tablet Take 40 mg by mouth daily.  12/08/13  Yes [provider]    Physical Exam: Vitals:   01/01/20 2218 01/01/20 2230 01/01/20 2330 01/02/20 0100  BP:  110/86 120/80 96/85  Pulse: (!) 48  87 (!) 101  Resp:   20 20  Temp:      TempSrc:      SpO2:   96% 99%  Weight:      Height:         Vitals:   01/01/20 2218 01/01/20 2230 01/01/20 2330 01/02/20 0100  BP:  110/86 120/80 96/85  Pulse: (!) 48  87 (!) 101  Resp:   20 20  Temp:      TempSrc:  SpO2:   96% 99%  Weight:      Height:        Constitutional: Alert and awake, oriented x3, not in any acute distress. Eyes: PERLA, EOMI, irises appear normal, anicteric sclera,  ENMT: external ears normal.  Mild trickling of blood seen from left nostril with Rhino Rocket in place            Lips appears normal, oropharynx mucosa, tongue, posterior pharynx appear normal  Neck: neck appears normal, no masses, normal ROM, no thyromegaly, no JVD  CVS: S1-S2 clear, no murmur rubs or gallops,  , no carotid bruits, pedal pulses palpable, No LE edema Respiratory:  clear to auscultation bilaterally, no wheezing, rales or rhonchi. Respiratory effort normal. No accessory muscle use.  Abdomen: soft nontender, nondistended, normal bowel sounds, no hepatosplenomegaly,  no hernias Musculoskeletal: : no cyanosis, clubbing , no contractures or atrophy Neuro: Cranial nerves II-XII intact, sensation, reflexes normal, strength Psych: judgement and insight appear normal, stable mood and affect,  Skin: no rashes or lesions or ulcers, no induration or nodules   Labs on Admission: I have personally reviewed following labs and imaging studies  CBC: Recent Labs  Lab 01/01/20 2258  WBC 7.8  NEUTROABS 6.1  HGB 7.8*  HCT 27.6*  MCV 102.6*  PLT 094*   Basic Metabolic Panel: Recent Labs  Lab 01/01/20 2258  NA 141  K 2.9*  CL 110  CO2 22  GLUCOSE 143*  BUN 22  CREATININE 1.04*  CALCIUM 8.4*   GFR: Estimated Creatinine Clearance: 33.9 mL/min (A) (by C-G formula based on SCr of 1.04 mg/dL (H)). Liver Function Tests: No results for input(s): AST, ALT, ALKPHOS, BILITOT, PROT, ALBUMIN in the last 168 hours. No results for input(s): LIPASE, AMYLASE in the last 168 hours. No results for input(s): AMMONIA in the last 168 hours. Coagulation Profile: Recent Labs  Lab 01/01/20 2258  INR 1.1   Cardiac Enzymes: No results for input(s): CKTOTAL, CKMB, CKMBINDEX, TROPONINI in the last 168 hours. BNP (last 3 results) No results for input(s): PROBNP in the last 8760 hours. HbA1C: No results for input(s): HGBA1C in the last 72 hours. CBG: No results for input(s): GLUCAP in the last 168 hours. Lipid Profile: No results for input(s): CHOL, HDL, LDLCALC, TRIG, CHOLHDL, LDLDIRECT in the last 72 hours. Thyroid Function Tests: No results for input(s): TSH, T4TOTAL, FREET4, T3FREE, THYROIDAB in the last 72 hours. Anemia Panel: No results for input(s): VITAMINB12, FOLATE, FERRITIN, TIBC, IRON, RETICCTPCT in the last 72 hours. Urine analysis: No results found for: COLORURINE, APPEARANCEUR, LABSPEC, PHURINE, GLUCOSEU, HGBUR, BILIRUBINUR, KETONESUR, PROTEINUR, UROBILINOGEN, NITRITE, LEUKOCYTESUR  Radiological Exams on Admission: No results found.  EKG:  Independently reviewed.   Assessment/Plan Principal Problem:   Epistaxis   Acute blood loss anemia -Hemoglobin 7.8.  Baseline unknown -Serial H&H -Bleeding appears controlled following Rhino Rocket and TXA -Keflex 500 3 times daily for 5 days -Hold aspirin and Plavix.  Aspirin for DVT prophylaxis -Consider ENT consult if recurrent, otherwise outpatient referral    Essential hypertension -Continue home antihypertensives amlodipine    History of MI (myocardial infarction) -Continue atorvastatin.  Hold aspirin and Plavix due to bleeding    DVT prophylaxis: SCDs  code Status: full code  Family Communication:  none  Disposition Plan: Back to previous home environment Consults called: none  Status:obs    Athena Masse MD [facility]     01/02/2020, 2:14 AM

## 2020-01-02 NOTE — ED Notes (Signed)
IV attempted in right forearm. unsuccessful at this time.

## 2020-01-02 NOTE — ED Notes (Signed)
Report given to Brandy, RN.

## 2020-01-02 NOTE — Progress Notes (Addendum)
Progress Note    Tracey Jackson  JIR:[id number] DOB: 1935/07/04  DOA: 01/01/2020 PCP: System, Pcp Not In      Brief Narrative:    Medical records reviewed and are as summarized below:  Tracey Jackson is an 85 y.o. female  with medical history significant for hypertension and remote history of MI on both aspirin and Plavix who presents to the emergency room with a nosebleed from the left nostril not responding to usual conservative measures.  She does have a history of nosebleeds.  Says she was having sinus congestion during the week and was blowing her nose a lot.  Bleeding had been ongoing for about 3 hours prior to arriving in the emergency room.       Assessment/Plan:   Principal Problem:   Epistaxis Active Problems:   Essential hypertension   History of MI (myocardial infarction)   Acute blood loss anemia   Severe anemia secondary to acute blood loss from epistaxis: Hemoglobin dropped from 7.4-6.7.  Discussed risks, benefits and alternatives to blood transfusion and patient is agreeable to blood transfusion.  Transfuse 1 unit of packed red blood cells.  Continue ferrous sulfate for iron deficiency anemia.  Epistaxis: Case was discussed with Dr. Pryor Ochoa, ENT physician.  He recommended stopping aspirin but continuing Plavix.  He also recommended oral antibiotics and outpatient follow-up on Tuesday, 01/06/2020 for Rhino Rocket to be removed.  CAD s/p coronary stent/ PVD: Hold aspirin and Plavix for now because of recent epistaxis  Hypertension: Continue antihypertensives  Hypokalemia and hypophosphatemia: Replete potassium and phosphorus and monitor levels.  Body mass index is 22.5 kg/m.      Family Communication/Anticipated D/C date and plan/Code Status   DVT prophylaxis: SCDs Code Status: Full code Family Communication: Plan discussed with patient Disposition Plan: Patient is from home.  Plan to discharge her home tomorrow when H&H is stable.      Subjective:     No complaints.  She said bleeding from the nose was stopped.  Objective:    Vitals:   01/02/20 1300 01/02/20 1330 01/02/20 1345 01/02/20 1430  BP: 140/64 (!) 143/82  (!) 143/75  Pulse: 81  82 63  Resp:    16  Temp:      TempSrc:      SpO2: 98%  97% 97%  Weight:      Height:        Intake/Output Summary (Last 24 hours) at 01/02/2020 1507 Last data filed at 01/02/2020 1133 Gross per 24 hour  Intake 500 ml  Output --  Net 500 ml   Filed Weights   01/01/20 2142  Weight: 57.6 kg    Exam:  GEN: NAD SKIN: No rash EYES: EOMI, pale conjunctiva, anicteric ENT: MMM, clotted/dried blood in b/l nares, Rhino Rocket in left nose CV: RRR PULM: CTA B ABD: soft, ND, NT, +BS CNS: AAO x 3, non focal EXT: No edema or tenderness   Data Reviewed:   I have personally reviewed following labs and imaging studies:  Labs: Labs show the following:   Basic Metabolic Panel: Recent Labs  Lab 01/01/20 2258 01/02/20 1247  NA 141 143  K 2.9* 3.5  CL 110 110  CO2 22 24  GLUCOSE 143* 135*  BUN 22 21  CREATININE 1.04* 1.07*  CALCIUM 8.4* 8.1*  MG 1.9  --   PHOS 1.9*  --    GFR Estimated Creatinine Clearance: 33 mL/min (A) (by C-G formula based on SCr of 1.07 mg/dL (H)).  Liver Function Tests: No results for input(s): AST, ALT, ALKPHOS, BILITOT, PROT, ALBUMIN in the last 168 hours. No results for input(s): LIPASE, AMYLASE in the last 168 hours. No results for input(s): AMMONIA in the last 168 hours. Coagulation profile Recent Labs  Lab 01/01/20 2258  INR 1.1    CBC: Recent Labs  Lab 01/01/20 2258 01/02/20 0750 01/02/20 1247  WBC 7.8 7.5  --   NEUTROABS 6.1  --   --   HGB 7.8* 7.2* 6.7*  HCT 27.6* 24.4* 23.7*  MCV 102.6* 99.6  --   PLT 426* 345  --    Cardiac Enzymes: No results for input(s): CKTOTAL, CKMB, CKMBINDEX, TROPONINI in the last 168 hours. BNP (last 3 results) No results for input(s): PROBNP in the last 8760 hours. CBG: No results for input(s):  GLUCAP in the last 168 hours. D-Dimer: No results for input(s): DDIMER in the last 72 hours. Hgb A1c: No results for input(s): HGBA1C in the last 72 hours. Lipid Profile: No results for input(s): CHOL, HDL, LDLCALC, TRIG, CHOLHDL, LDLDIRECT in the last 72 hours. Thyroid function studies: No results for input(s): TSH, T4TOTAL, T3FREE, THYROIDAB in the last 72 hours.  Invalid input(s): FREET3 Anemia work up: Recent Labs    01/02/20 1247  FERRITIN 21  TIBC 262  IRON 14*   Sepsis Labs: Recent Labs  Lab 01/01/20 2258 01/02/20 0750  WBC 7.8 7.5    Microbiology Recent Results (from the past 240 hour(s))  Respiratory Panel by RT PCR (Flu A&B, Covid) - Nasopharyngeal Swab     Status: None   Collection Time: 01/02/20  3:59 AM   Specimen: Nasopharyngeal Swab  Result Value Ref Range Status   SARS Coronavirus 2 by RT PCR NEGATIVE NEGATIVE Final    Comment: (NOTE) SARS-CoV-2 target nucleic acids are NOT DETECTED. The SARS-CoV-2 RNA is generally detectable in upper respiratoy specimens during the acute phase of infection. The lowest concentration of SARS-CoV-2 viral copies this assay can detect is 131 copies/mL. A negative result does not preclude SARS-Cov-2 infection and should not be used as the sole basis for treatment or other patient management decisions. A negative result may occur with  improper specimen collection/handling, submission of specimen other than nasopharyngeal swab, presence of viral mutation(s) within the areas targeted by this assay, and inadequate number of viral copies (<131 copies/mL). A negative result must be combined with clinical observations, patient history, and epidemiological information. The expected result is Negative. Fact Sheet for Patients:  PinkCheek.be Fact Sheet for Healthcare Providers:  GravelBags.it This test is not yet ap proved or cleared by the [place] FDA and  has been  authorized for detection and/or diagnosis of SARS-CoV-2 by FDA under an Emergency Use Authorization (EUA). This EUA will remain  in effect (meaning this test can be used) for the duration of the COVID-19 declaration under Section 564(b)(1) of the Act, 21 U.S.C. section 360bbb-3(b)(1), unless the authorization is terminated or revoked sooner.    Influenza A by PCR NEGATIVE NEGATIVE Final   Influenza B by PCR NEGATIVE NEGATIVE Final    Comment: (NOTE) The Xpert Xpress SARS-CoV-2/FLU/RSV assay is intended as an aid in  the diagnosis of influenza from Nasopharyngeal swab specimens and  should not be used as a sole basis for treatment. Nasal washings and  aspirates are unacceptable for Xpert Xpress SARS-CoV-2/FLU/RSV  testing. Fact Sheet for Patients: PinkCheek.be Fact Sheet for Healthcare Providers: GravelBags.it This test is not yet approved or cleared by the [place] FDA  and  has been authorized for detection and/or diagnosis of SARS-CoV-2 by  FDA under an Emergency Use Authorization (EUA). This EUA will remain  in effect (meaning this test can be used) for the duration of the  Covid-19 declaration under Section 564(b)(1) of the Act, 21  U.S.C. section 360bbb-3(b)(1), unless the authorization is  terminated or revoked. Performed at [facility], [address] [place], [place] [address]     Procedures and diagnostic studies:  No results found.  Medications:   . amLODipine  5 mg Oral Daily  . atorvastatin  10 mg Oral QHS  . cephALEXin  500 mg Oral Q8H  . [START ON 01/03/2020] ferrous sulfate  325 mg Oral Q breakfast  . folic acid  1 mg Oral Daily  . [START ON 01/03/2020] furosemide  20 mg Oral Daily  . pantoprazole  40 mg Oral Daily  . tranexamic acid  500 mg Topical Once   Continuous Infusions:   LOS: 0 days   Sheffield Hawker  [facility]     01/02/2020, 3:07 PM

## 2020-01-02 NOTE — ED Notes (Signed)
Pt resting on stretcher awake and talkative. Pt family has gone home for the night. Pt nose continues to bleeding small amount. Pt using tissue to clean herself and is able to spit when blood goes down the back of her throat. MD aware. Provided for comfort and safety and will continue to assess.

## 2020-01-03 LAB — BPAM RBC
Blood Product Expiration Date: 202103282359
ISSUE DATE / TIME: 202103191700
Unit Type and Rh: 600

## 2020-01-03 LAB — TYPE AND SCREEN
ABO/RH(D): A POS
Antibody Screen: NEGATIVE
Unit division: 0

## 2020-01-03 LAB — HEMOGLOBIN AND HEMATOCRIT, BLOOD
HCT: 27.6 % — ABNORMAL LOW (ref 36.0–46.0)
Hemoglobin: 8.4 g/dL — ABNORMAL LOW (ref 12.0–15.0)

## 2020-01-03 MED ORDER — CEPHALEXIN 500 MG PO CAPS: 500.0000 mg | ORAL_CAPSULE | Freq: Two times a day (BID) | ORAL | 0 refills | Status: DC

## 2020-01-03 MED ORDER — CEPHALEXIN 500 MG PO CAPS
500.0000 mg | ORAL_CAPSULE | Freq: Three times a day (TID) | ORAL | Status: DC
  Administered 2020-01-03: 500 mg via ORAL
  Filled 2020-01-03: qty 1

## 2020-01-03 NOTE — Progress Notes (Signed)
Pt post transfusion H&H resulted at 8.4. NP, E. Ouma notified of results. Instructed to hold second unit of blood.

## 2020-01-03 NOTE — Discharge Summary (Signed)
Physician Discharge Summary  Tracey Jackson WFU:[id number] DOB: [birth year] DOA: 01/01/2020  PCP: System, Pcp Not In  Admit date: 01/01/2020 Discharge date: 01/03/2020  Discharge disposition: Home   Recommendations for Outpatient Follow-Up:   Follow-up with ENT physician, Dr. Pryor Ochoa, on 01/06/2020   Discharge Diagnosis:   Principal Problem:   Epistaxis Active Problems:   Essential hypertension   History of MI (myocardial infarction)   Acute blood loss anemia    Discharge Condition: Stable.  Diet recommendation: low salt diet  Code status: Full code.    Hospital Course:   [name] is an 85 y.o. female with medical history significant forhypertension and remote history of MI on both aspirin and Plavix who presented to the emergency room with a nosebleed from the left nostril not responding to usual conservative measures. She does have a history of nosebleeds. She said she was having sinus congestion during the week and was blowing her nose a lot. Bleeding had been ongoing for about 3 hours prior to arriving in the emergency room.   In the emergency room, bleeding could not beontrolled so a Rhino Rocket was placed on the left side.  She was admitted to the hospital for monitoring because she was on aspirin and Plavix.  Her hemoglobin dropped to 6.7 so she was transfused with 1 unit of packed red blood cells for acute blood loss anemia secondary to epistaxis.  Case was discussed with ENT physician on-call, Dr. Pryor Ochoa.  He recommended that aspirin be held at discharge but it was okay for patient to continue with Plavix.  He also recommended oral antibiotics at discharge and outpatient follow-up for further management.  Of note, patient was admitted as an inpatient but her condition improved rather quickly associated hospital about 2 midnights in the hospital.     Discharge Exam:   Vitals:   01/02/20 2330 01/03/20 0800  BP: (!) 142/86 (!) 158/78  Pulse: 97 98    Resp: 20   Temp: 98.3 F (36.8 C) 97.8 F (36.6 C)  SpO2: 98% 100%   Vitals:   01/02/20 2157 01/02/20 2200 01/02/20 2330 01/03/20 0800  BP:  (!) 130/110 (!) 142/86 (!) 158/78  Pulse: 98 97 97 98  Resp: 20 20 20    Temp:   98.3 F (36.8 C) 97.8 F (36.6 C)  TempSrc:   Oral Oral  SpO2: 97% 95% 98% 100%  Weight:      Height:         GEN: NAD SKIN: No rash EYES: EOMI ENT: MMM, no epistaxis, Rhino Rocket in the left nostril CV: RRR PULM: CTA B ABD: soft, ND, NT, +BS CNS: AAO x 3, non focal EXT: No edema or tenderness   The results of significant diagnostics from this hospitalization (including imaging, microbiology, ancillary and laboratory) are listed below for reference.     Procedures and Diagnostic Studies:   No results found.   Labs:   Basic Metabolic Panel: Recent Labs  Lab 01/01/20 2258 01/02/20 1247  NA 141 143  K 2.9* 3.5  CL 110 110  CO2 22 24  GLUCOSE 143* 135*  BUN 22 21  CREATININE 1.04* 1.07*  CALCIUM 8.4* 8.1*  MG 1.9  --   PHOS 1.9*  --    GFR Estimated Creatinine Clearance: 33 mL/min (A) (by C-G formula based on SCr of 1.07 mg/dL (H)). Liver Function Tests: No results for input(s): AST, ALT, ALKPHOS, BILITOT, PROT, ALBUMIN in the last 168 hours. No results for input(s):  LIPASE, AMYLASE in the last 168 hours. No results for input(s): AMMONIA in the last 168 hours. Coagulation profile Recent Labs  Lab 01/01/20 2258  INR 1.1    CBC: Recent Labs  Lab 01/01/20 2258 01/02/20 0750 01/02/20 1247 01/03/20 0045  WBC 7.8 7.5  --   --   NEUTROABS 6.1  --   --   --   HGB 7.8* 7.2* 6.7* 8.4*  HCT 27.6* 24.4* 23.7* 27.6*  MCV 102.6* 99.6  --   --   PLT 426* 345  --   --    Cardiac Enzymes: No results for input(s): CKTOTAL, CKMB, CKMBINDEX, TROPONINI in the last 168 hours. BNP: Invalid input(s): POCBNP CBG: No results for input(s): GLUCAP in the last 168 hours. D-Dimer No results for input(s): DDIMER in the last 72 hours. Hgb  A1c No results for input(s): HGBA1C in the last 72 hours. Lipid Profile No results for input(s): CHOL, HDL, LDLCALC, TRIG, CHOLHDL, LDLDIRECT in the last 72 hours. Thyroid function studies No results for input(s): TSH, T4TOTAL, T3FREE, THYROIDAB in the last 72 hours.  Invalid input(s): FREET3 Anemia work up Recent Labs    01/02/20 1247  VITAMINB12 323  FERRITIN 21  TIBC 262  IRON 14*   Microbiology Recent Results (from the past 240 hour(s))  Respiratory Panel by RT PCR (Flu A&B, Covid) - Nasopharyngeal Swab     Status: None   Collection Time: 01/02/20  3:59 AM   Specimen: Nasopharyngeal Swab  Result Value Ref Range Status   SARS Coronavirus 2 by RT PCR NEGATIVE NEGATIVE Final    Comment: (NOTE) SARS-CoV-2 target nucleic acids are NOT DETECTED. The SARS-CoV-2 RNA is generally detectable in upper respiratoy specimens during the acute phase of infection. The lowest concentration of SARS-CoV-2 viral copies this assay can detect is 131 copies/mL. A negative result does not preclude SARS-Cov-2 infection and should not be used as the sole basis for treatment or other patient management decisions. A negative result may occur with  improper specimen collection/handling, submission of specimen other than nasopharyngeal swab, presence of viral mutation(s) within the areas targeted by this assay, and inadequate number of viral copies (<131 copies/mL). A negative result must be combined with clinical observations, patient history, and epidemiological information. The expected result is Negative. Fact Sheet for Patients:  PinkCheek.be Fact Sheet for Healthcare Providers:  GravelBags.it This test is not yet ap proved or cleared by the [place] FDA and  has been authorized for detection and/or diagnosis of SARS-CoV-2 by FDA under an Emergency Use Authorization (EUA). This EUA will remain  in effect (meaning this test can be  used) for the duration of the COVID-19 declaration under Section 564(b)(1) of the Act, 21 U.S.C. section 360bbb-3(b)(1), unless the authorization is terminated or revoked sooner.    Influenza A by PCR NEGATIVE NEGATIVE Final   Influenza B by PCR NEGATIVE NEGATIVE Final    Comment: (NOTE) The Xpert Xpress SARS-CoV-2/FLU/RSV assay is intended as an aid in  the diagnosis of influenza from Nasopharyngeal swab specimens and  should not be used as a sole basis for treatment. Nasal washings and  aspirates are unacceptable for Xpert Xpress SARS-CoV-2/FLU/RSV  testing. Fact Sheet for Patients: PinkCheek.be Fact Sheet for Healthcare Providers: GravelBags.it This test is not yet approved or cleared by the [place] FDA and  has been authorized for detection and/or diagnosis of SARS-CoV-2 by  FDA under an Emergency Use Authorization (EUA). This EUA will remain  in effect (meaning this test  can be used) for the duration of the  Covid-19 declaration under Section 564(b)(1) of the Act, 21  U.S.C. section 360bbb-3(b)(1), unless the authorization is  terminated or revoked. Performed at [facility], [address] [place], [place] [address]      Discharge Instructions:   Discharge Instructions    Diet - low sodium heart healthy   Complete by: As directed    Discharge instructions   Complete by: As directed    Hold Aspirin until you see the ENT physician for [facility] removal   Increase activity slowly   Complete by: As directed      Allergies as of 01/03/2020   No Known Allergies     Medication List    STOP taking these medications   aspirin EC 81 MG tablet     TAKE these medications   amLODipine 5 MG tablet Commonly known as: NORVASC Take 5 mg by mouth daily.   atorvastatin 10 MG tablet Commonly known as: LIPITOR Take 10 mg by mouth at bedtime.   cephALEXin 500 MG capsule Commonly known as:  KEFLEX Take 1 capsule (500 mg total) by mouth 2 (two) times daily.   clopidogrel 75 MG tablet Commonly known as: PLAVIX Take 75 mg by mouth daily.   ferrous sulfate 325 (65 FE) MG tablet Take 325 mg by mouth daily with breakfast.   folic acid 1 MG tablet Commonly known as: FOLVITE Take 1 mg by mouth daily.   furosemide 20 MG tablet Commonly known as: LASIX Take 20 mg by mouth daily.   pantoprazole 40 MG tablet Commonly known as: PROTONIX Take 40 mg by mouth daily.      Follow-up Information    Carloyn Manner, MD. Go on 01/06/2020.   Specialty: Otolaryngology Why: See Dr. Pryor Ochoa on Tuesday, 01/06/2020, for Tracey Jackson to be removed Contact information: [place] [id number] [phone]            Time coordinating discharge: 26 minutes  Signed:  Jennye Boroughs  [facility] 01/03/2020, 9:28 AM

## 2020-01-03 NOTE — Progress Notes (Signed)
Morning medications administered per orders. Pt. Sitting up on edge of bed, denies any current needs. Call light is within reach, will continue to monitor.

## 2020-01-03 NOTE — Progress Notes (Signed)
Bedside shift report received from [place], [place]. Morning assessment completed. Pt. Resting in bed comfortably, arouses easily to name calling. Denies any pain or needs at this time. Call light is within reach, will continue to monitor.

## 2020-01-03 NOTE — Progress Notes (Signed)
Lab notified this RN of pt refusal of post transfusion H&H. Results needed prior to hanging unit 2 of 2. Educated pt of H&H and that result are needed in order to see if she remains to need more blood transfused. Pt as of current is agreeable to have H&H drawn. Lab tech stated she would send another lab tech around to obtain specimen.

## 2020-01-31 ENCOUNTER — Ambulatory Visit: Payer: Self-pay

## 2020-01-31 ENCOUNTER — Ambulatory Visit: Payer: Self-pay | Attending: Internal Medicine

## 2020-01-31 DIAGNOSIS — Z23 Encounter for immunization: Secondary | ICD-10-CM

## 2020-01-31 NOTE — Progress Notes (Signed)
   Covid-19 Vaccination Clinic  Name:  Tracey Jackson    MRN: [id number] DOB: Dec 28, 1934  01/31/2020  [name] was observed post Covid-19 immunization for 15 minutes without incident. She was provided with Vaccine Information Sheet and instruction to access the V-Safe system.   [name] was instructed to call 911 with any severe reactions post vaccine: Marland Kitchen Difficulty breathing  . Swelling of face and throat  . A fast heartbeat  . A bad rash all over body  . Dizziness and weakness   Immunizations Administered    Name Date Dose VIS Date Route   Pfizer COVID-19 Vaccine 01/31/2020  8:39 AM 0.3 mL 09/26/2019 Intramuscular   Manufacturer: [place]   Lot: [id number]   [place]: [id number]

## 2020-02-23 ENCOUNTER — Ambulatory Visit: Payer: Self-pay | Attending: Internal Medicine

## 2020-02-23 DIAGNOSIS — Z23 Encounter for immunization: Secondary | ICD-10-CM

## 2020-02-23 NOTE — Progress Notes (Signed)
   Covid-19 Vaccination Clinic  Name:  ARNETTE DRIGGS    MRN: [id number] DOB: [birth year]  02/23/2020  Ms. Bascomb was observed post Covid-19 immunization for 15 minutes without incident. She was provided with Vaccine Information Sheet and instruction to access the V-Safe system.   Ms. Bellino was instructed to call 911 with any severe reactions post vaccine: Marland Kitchen Difficulty breathing  . Swelling of face and throat  . A fast heartbeat  . A bad rash all over body  . Dizziness and weakness   Immunizations Administered    Name Date Dose VIS Date Route   Pfizer COVID-19 Vaccine 02/23/2020  9:36 AM 0.3 mL 12/10/2018 Intramuscular   Manufacturer: [place]   Lot: [id number]   [place]: [id number]

## 2020-03-10 ENCOUNTER — Other Ambulatory Visit: Payer: Self-pay

## 2020-03-10 DIAGNOSIS — F17210 Nicotine dependence, cigarettes, uncomplicated: Secondary | ICD-10-CM | POA: Insufficient documentation

## 2020-03-10 DIAGNOSIS — I2510 Atherosclerotic heart disease of native coronary artery without angina pectoris: Secondary | ICD-10-CM | POA: Insufficient documentation

## 2020-03-10 DIAGNOSIS — R600 Localized edema: Secondary | ICD-10-CM | POA: Diagnosis not present

## 2020-03-10 DIAGNOSIS — Z79899 Other long term (current) drug therapy: Secondary | ICD-10-CM | POA: Insufficient documentation

## 2020-03-10 DIAGNOSIS — R918 Other nonspecific abnormal finding of lung field: Secondary | ICD-10-CM | POA: Insufficient documentation

## 2020-03-10 DIAGNOSIS — Z7902 Long term (current) use of antithrombotics/antiplatelets: Secondary | ICD-10-CM | POA: Insufficient documentation

## 2020-03-10 DIAGNOSIS — I10 Essential (primary) hypertension: Secondary | ICD-10-CM | POA: Insufficient documentation

## 2020-03-10 LAB — BASIC METABOLIC PANEL
Anion gap: 10 (ref 5–15)
BUN: 16 mg/dL (ref 8–23)
CO2: 25 mmol/L (ref 22–32)
Calcium: 9 mg/dL (ref 8.9–10.3)
Chloride: 106 mmol/L (ref 98–111)
Creatinine, Ser: 0.92 mg/dL (ref 0.44–1.00)
GFR calc Af Amer: >60 mL/min (ref >60)
GFR calc non Af Amer: 58 mL/min — ABNORMAL LOW (ref >60)
Glucose, Bld: 112 mg/dL — ABNORMAL HIGH (ref 70–99)
Potassium: 3 mmol/L — ABNORMAL LOW (ref 3.5–5.1)
Sodium: 141 mmol/L (ref 135–145)

## 2020-03-10 LAB — CBC
HCT: 36.2 % (ref 36.0–46.0)
Hemoglobin: 11.4 g/dL — ABNORMAL LOW (ref 12.0–15.0)
MCH: 29.3 pg (ref 26.0–34.0)
MCHC: 31.5 g/dL (ref 30.0–36.0)
MCV: 93.1 fL (ref 80.0–100.0)
Platelets: 324 10*3/uL (ref 150–400)
RBC: 3.89 MIL/uL (ref 3.87–5.11)
RDW: 16.6 % — ABNORMAL HIGH (ref 11.5–15.5)
WBC: 6.5 10*3/uL (ref 4.0–10.5)
nRBC: 0 % (ref 0.0–0.2)

## 2020-03-10 NOTE — ED Triage Notes (Addendum)
Pt comes via POV from home with c/o bilateral leg swelling. Pt states this started yesterday and has gotten worse today.  Pt states she does take lasix.  Pt has notable swelling to bilateral ankles and feet.  HR showing 44, Will verify with EKG

## 2020-03-11 ENCOUNTER — Emergency Department
Admission: EM | Admit: 2020-03-11 | Discharge: 2020-03-11 | Disposition: A | Discharge status: Home / Self Care | Payer: Medicare Other | Attending: Emergency Medicine | Admitting: Emergency Medicine

## 2020-03-11 ENCOUNTER — Emergency Department: Payer: Medicare Other

## 2020-03-11 DIAGNOSIS — R609 Edema, unspecified: Secondary | ICD-10-CM

## 2020-03-11 DIAGNOSIS — R918 Other nonspecific abnormal finding of lung field: Secondary | ICD-10-CM

## 2020-03-11 LAB — BRAIN NATRIURETIC PEPTIDE: B Natriuretic Peptide: 164.8 pg/mL — ABNORMAL HIGH (ref 0.0–100.0)

## 2020-03-11 MED ORDER — FUROSEMIDE 20 MG PO TABS: 20.0000 mg | ORAL_TABLET | Freq: Every day | ORAL | 0 refills | Status: DC

## 2020-03-11 MED ORDER — IOHEXOL 300 MG/ML  SOLN: 75.0000 mL | Freq: Once | INTRAMUSCULAR | Status: DC | PRN

## 2020-03-11 MED ORDER — IOHEXOL 350 MG/ML SOLN
75.0000 mL | Freq: Once | INTRAVENOUS | Status: AC | PRN
  Administered 2020-03-11: 75 mL via INTRAVENOUS

## 2020-03-11 MED ORDER — POTASSIUM CHLORIDE CRYS ER 20 MEQ PO TBCR
40.0000 meq | EXTENDED_RELEASE_TABLET | Freq: Once | ORAL | Status: AC
  Administered 2020-03-11: 40 meq via ORAL
  Filled 2020-03-11: qty 2

## 2020-03-11 MED ORDER — FUROSEMIDE 10 MG/ML IJ SOLN
40.0000 mg | Freq: Once | INTRAMUSCULAR | Status: AC
  Administered 2020-03-11: 40 mg via INTRAVENOUS
  Filled 2020-03-11: qty 4

## 2020-03-11 NOTE — ED Provider Notes (Signed)
[facility] Emergency Department Provider Note  ____________________________________________   First MD Initiated Contact with Patient 03/11/20 0205     (approximate)  I have reviewed the triage vital signs and the nursing notes.   HISTORY  Chief Complaint Leg Swelling   HPI Tracey Jackson is a 85 y.o. female with history of hypertension myocardial infarction anemia, bilateral toe amputation and congestive heart failure.  Presents to the emergency department secondary to 1 day history of painless bilateral lower extremity swelling.  Patient denies any chest pain no shortness of breath.  Patient denies any orthopnea.  Patient denies any fever or cough.  Patient states that she has been taking her medications including Lasix as prescribed.       Past Medical History:  Diagnosis Date  . Coronary artery disease   . Hypertension     Patient Active Problem List   Diagnosis Date Noted  . Epistaxis 01/02/2020  . Essential hypertension 01/02/2020  . History of MI (myocardial infarction) 01/02/2020  . Acute blood loss anemia 01/02/2020    Past Surgical History:  Procedure Laterality Date  . CARDIAC SURGERY     Stent Placement    Prior to Admission medications   Medication Sig Start Date End Date Taking? Authorizing Provider  amLODipine (NORVASC) 5 MG tablet Take 5 mg by mouth daily.    [provider]  atorvastatin (LIPITOR) 10 MG tablet Take 10 mg by mouth at bedtime.    [provider]  cephALEXin (KEFLEX) 500 MG capsule Take 1 capsule (500 mg total) by mouth 2 (two) times daily. 01/03/20   Jennye Boroughs, MD  clopidogrel (PLAVIX) 75 MG tablet Take 75 mg by mouth daily.  01/28/14   [provider]  ferrous sulfate 325 (65 FE) MG tablet Take 325 mg by mouth daily with breakfast.    [provider]  folic acid (FOLVITE) 1 MG tablet Take 1 mg by mouth daily.    [provider]  furosemide (LASIX) 20 MG tablet  Take 20 mg by mouth daily.  01/12/14   [provider]  pantoprazole (PROTONIX) 40 MG tablet Take 40 mg by mouth daily.  12/08/13   [provider]    Allergies Patient has no known allergies.  No family history on file.  Social History Social History   Tobacco Use  . Smoking status: Current Every Day Smoker    Packs/day: 0.25    Types: Cigarettes  . Smokeless tobacco: Never Used  Substance Use Topics  . Alcohol use: No  . Drug use: No    Review of Systems Constitutional: No fever/chills Eyes: No visual changes. ENT: No sore throat. Cardiovascular: Denies chest pain. Respiratory: Denies shortness of breath. Gastrointestinal: No abdominal pain.  No nausea, no vomiting.  No diarrhea.  No constipation. Genitourinary: Negative for dysuria. Musculoskeletal: Negative for neck pain.  Negative for back pain.  Positive for bilateral ankle and feet swelling Integumentary: Negative for rash. Neurological: Negative for headaches, focal weakness or numbness.  ____________________________________________   PHYSICAL EXAM:  VITAL SIGNS: ED Triage Vitals  Enc Vitals Group     BP 03/10/20 1625 (!) 159/110     Pulse Rate 03/10/20 1625 (!) 44     Resp 03/10/20 1625 17     Temp 03/10/20 1625 98 F (36.7 C)     Temp src --      SpO2 03/10/20 1625 100 %     Weight 03/10/20 1626 56.7 kg (125 lb)  Height 03/10/20 1626 1.613 m (5' 3.5")     Head Circumference --      Peak Flow --      Pain Score 03/10/20 1626 2     Pain Loc --      Pain Edu? --      Excl. in [place]? --     Constitutional: Alert and oriented.  Eyes: Conjunctivae are normal.  Mouth/Throat: Patient is wearing a mask. Neck: No stridor.  No meningeal signs.   Cardiovascular: Normal rate, regular rhythm. Good peripheral circulation. Grossly normal heart sounds. Respiratory: Normal respiratory effort.  No retractions. Gastrointestinal: Soft and nontender. No distention.  Musculoskeletal: 1+ bilateral  lower extremity pitting edema of the ankles and feet Neurologic:  Normal speech and language. No gross focal neurologic deficits are appreciated.  Skin:  Skin is warm, dry and intact. Psychiatric: Mood and affect are normal. Speech and behavior are normal.  ____________________________________________   LABS (all labs ordered are listed, but only abnormal results are displayed)  Labs Reviewed  CBC - Abnormal; Notable for the following components:      Result Value   Hemoglobin 11.4 (*)    RDW 16.6 (*)    All other components within normal limits  BASIC METABOLIC PANEL - Abnormal; Notable for the following components:   Potassium 3.0 (*)    Glucose, Bld 112 (*)    GFR calc non Af Amer 58 (*)    All other components within normal limits  BRAIN NATRIURETIC PEPTIDE - Abnormal; Notable for the following components:   B Natriuretic Peptide 164.8 (*)    All other components within normal limits   ____________________________________________  EKG  ED ECG REPORT I, [place] N BROWN, the attending physician, personally viewed and interpreted this ECG.   Date: 03/10/2020  EKG Time: 4:27 PM  Rate: 95  Rhythm: Sinus rhythm with premature ventricular contraction  Axis: Normal  Intervals: Normal  ST&T Change: None  ____________________________________________  RADIOLOGY I, [place] N BROWN, personally viewed and evaluated these images (plain radiographs) as part of my medical decision making, as well as reviewing the written report by the radiologist.  ED MD interpretation: Lower extremity venous ultrasound revealed no evidence of DVT however incidental finding of an occluded possible femoral bypass graft  Chest x-ray revealed mild cardiomegaly with borderline vascular congestion with concern for possible right paratracheal/suprahilar density  Official radiology report(s): US Venous Img Lower Bilateral  Addendum Date: 03/11/2020   ADDENDUM REPORT: 03/11/2020 04:14 ADDENDUM: These  results were called by telephone at the time of interpretation on 03/11/2020 at 4:14 am to provider [facility] , who verbally acknowledged these results. Electronically Signed   By: Lovena Le M.D.   On: 03/11/2020 04:14   Result Date: 03/11/2020 CLINICAL DATA:  Pain and swelling had his EXAM: BILATERAL LOWER EXTREMITY VENOUS DOPPLER ULTRASOUND TECHNIQUE: Gray-scale sonography with compression, as well as color and duplex ultrasound, were performed to evaluate the deep venous system(s) from the level of the common femoral vein through the popliteal and proximal calf veins. COMPARISON:  None. FINDINGS: VENOUS Normal compressibility of the common femoral, superficial femoral, and popliteal veins, as well as the visualized calf veins. Visualized portions of profunda femoral vein and great saphenous vein unremarkable. No filling defects to suggest DVT on grayscale or color Doppler imaging. Doppler waveforms show normal direction of venous flow, normal respiratory plasticity and response to augmentation. OTHER Incidental note is made of a left lower extremity vascular graft coursing in the more superficial  soft tissues which appears completely occluded with hypoechoic thrombus with absent flow on color Doppler imaging. Limitations: none IMPRESSION: No femoropopliteal DVT nor evidence of DVT within the visualized calf veins. If clinical symptoms are inconsistent or if there are persistent or worsening symptoms, further imaging (possibly involving the iliac veins) may be warranted. Incidental note made of a left lower extremity vascular graft, possibly femoral bypass graft which appears completely occluded without demonstrable color flow. Correlate with patient's surgical history. Electronically Signed: By: Lovena Le M.D. On: 03/11/2020 04:00   DG Chest Portable 1 View  Result Date: 03/11/2020 CLINICAL DATA:  Dyspnea.  Bilateral leg swelling EXAM: PORTABLE CHEST 1 VIEW COMPARISON:  None. FINDINGS:  Cardiomegaly and aortic tortuosity. Prominent right paratracheal/suprahilar density which may be from rotation but needs further characterization. Borderline vascular congestion. No visible effusion or pneumothorax IMPRESSION: 1. Mild cardiomegaly and borderline vascular congestion 2. Right paratracheal/suprahilar density may be from patient rotation, but when able recommend two-view chest x-ray. Electronically Signed   By: Monte Fantasia M.D.   On: 03/11/2020 06:21     Procedures   ____________________________________________   INITIAL IMPRESSION / MDM / [place] / ED COURSE  As part of my medical decision making, I reviewed the following data within the [place] NUMBER   85 year old female presented with above-stated history and physical exam a differential diagnosis including but not limited to dependent edema, CHF exacerbation, DVT.  Venous Doppler ultrasound revealed no evidence of DVT however did revealed evidence of possible femoral bypass graft occlusion which patient states was done approximately 5 years ago.  Patient with no pain in the lower extremity and as such acute arterial occlusion is unlikely.  Patient discussed with Dr. Delana Meyer vascular surgeon who is willing to follow the patient in consultation.  Chest x-ray was performed which revealed possible right paratracheal mass and as such CT angiogram of chest abdomen pelvis ordered to evaluate possible mass in the chest as well as to evaluate the possibility of aortobifem occlusion.  Patient's disposition dependent upon CT scan findings.  Patient's care transferred to Dr. Jari Pigg  ____________________________________________  FINAL CLINICAL IMPRESSION(S) / ED DIAGNOSES  Final diagnoses:  Peripheral edema  Lung mass     MEDICATIONS GIVEN DURING THIS VISIT:  Medications  iohexol (OMNIPAQUE) 300 MG/ML solution 75 mL (has no administration in time range)  furosemide (LASIX) injection 40 mg (40 mg  Intravenous Given 03/11/20 0231)     ED Discharge Orders    None      *Please note:  Tracey Jackson was evaluated in Emergency Department on 03/11/2020 for the symptoms described in the history of present illness. She was evaluated in the context of the global COVID-19 pandemic, which necessitated consideration that the patient might be at risk for infection with the SARS-CoV-2 virus that causes COVID-19. Institutional protocols and algorithms that pertain to the evaluation of patients at risk for COVID-19 are in a state of rapid change based on information released by regulatory bodies including the CDC and federal and state organizations. These policies and algorithms were followed during the patient's care in the ED.  Some ED evaluations and interventions may be delayed as a result of limited staffing during the pandemic.*  Note:  This document was prepared using Dragon voice recognition software and may include unintentional dictation errors.   Gregor Hams, MD 03/11/20 [phone]

## 2020-03-11 NOTE — ED Notes (Signed)
Pt's nephew at bedside, states he will return in 15 mins to take patient home.

## 2020-03-11 NOTE — Discharge Instructions (Addendum)
Your CT scan is as below.  For the lung mass is concerning for cancer but at this time we have elected not to want further work-up for this.  I am giving you the name of the oncologist to follow-up with if you change your mind.  For your legs.  You have some chronic vascular disease but nothing acute.  You can follow-up with Dr. Delana Meyer.  Please call to make an appointment.  We will trialing you on 3 days of Lasix given according to your daughter you have run out.  See this helps with your swelling.  You should follow-up with your primary care doctor in 1 week for lab recheck and if the Lasix is improving continuing it.    IMPRESSION: Vascular findings:   1. No acute finding. 2. Right axillobifemoral graft with occluded left limb. Left-sided hypogastric and lower extremity arteries reconstitute from numerous well-formed collaterals. 3. Right iliac occlusion with reconstitution from the axillofemoral bypass. 4. Abdominal aortic aneurysm beginning at the renal arteries and measuring up to 4.6 cm in diameter. No available comparisons, Recommend followup by abdomen and pelvis CTA in 6 months, and vascular surgery referral/consultation if not already obtained. This recommendation follows ACR consensus guidelines: White Paper of the ACR Incidental Findings Committee II on Vascular Findings. J Am Coll Radiol 2013; 10:789-794. Aortic aneurysm NOS (ICD10-I71.9)   Nonvascular findings:   1. 3.5 cm spiculated mass in the right lower lobe primarily worrisome for bronchogenic carcinoma. Right bronchial adenopathy is present. There is also an anterior mediastinal mass measuring 2.7 cm, an atypical location to reflect solitary mediastinal nodal metastasis, more likely thymic. Recommend multi disciplinary thoracic oncology follow-up. 2. There also ground-glass opacities and nodules in the right upper lobe which could be inflammatory or neoplastic. 3. Polycystic kidneys. Solid enhancing or complex  cystic lesions could be present on both sides, attention on follow-up staging scans. 4. Cholelithiasis.

## 2020-03-11 NOTE — ED Notes (Signed)
Pt ambulated around room by this RN. Pt tolerated well, denies SOB, O2 sats 93-98% on RA. Pt back to bed without incident.

## 2020-03-11 NOTE — ED Provider Notes (Signed)
8:28 AM Assumed care for off going team.   Blood pressure (!) 190/91, pulse 90, temperature 98 F (36.7 C), resp. rate (!) 23, height 5' 3.5" (1.613 m), weight 56.7 kg, SpO2 98 %.  See their HPI for full report but in brief   Chest mass on R adenopathy as well. Most likely cancer. Vasculopathy finding looking chronic  Discussed with pt and she said she knew about mass and does not want surgery, radiation, chemotherapy. She would like to go home. Pt taking off oxygen, per Dr. Owens Shark does sound like she was truly requiring.  Will trial her off and ambulate.   Patient saturations at lowest were 91% with laying flat but she was still able to ambulate.  Patient states that she did not feel short of breath at all.  Patient states that she does not want to come to the hospital does not feel that she needs oxygen.  Patient gave me permission to discuss with her family the results of the CT scan.  I discussed both with the daughter and the son about the CT scan and how it was concerning for cancer.  They both want to talk to the patient.  Even after the discussion with the family patient still states that she does not want to come into the hospital.  Patient states that she is 85 years old and does not want to have chemotherapy or radiation.  Patient states that she knows that she could potentially die from this or that she could die from natural causes before this could even kill her.  She states that she does not want to have any interventions done at this time.  She is willing to get the name of an oncologist to follow-up with if she changes her mind.  Patient has capacity to make this decision.  Her family is aware of the above situation and they stated that at this time that they were okay with [place] discharging her home and they would talk to her more about things.  When I talked to the daughter as well she stated that she has not been taking her Lasix because they ran out of medication.  Will prescribe her  20 mg for 3 doses to see if this has any improvement and stated that she would need to follow-up with her primary care doctor for electrolyte recheck given her low potassium and if it is effective with the swelling that she could maybe continue on it longer.  I also discussed with them following up outpatient for the vascular issues.  Her feet are warm and did not loook like an acute issue.   I discussed the provisional nature of ED diagnosis, the treatment so far, the ongoing plan of care, follow up appointments and return precautions with the patient and any family or support people present. They expressed understanding and agreed with the plan, discharged home.        [name], MD 03/11/20 1104

## 2020-03-11 NOTE — ED Notes (Signed)
This RN to bedside at this time. Call bell within reach at this time. Pt denies any needs at this time. Pt asking if podiatrist on staff due to recurrent corn on her foot. This RN explained did not have podiatrist on staff. Pt states understanding. Pt alert, calm cooperative and pleasant. Lights dimmed for patient comfort at this time, states understanding to use call bell for further needs.

## 2020-03-11 NOTE — ED Notes (Signed)
Attempt to call the daughter unsuccessful.

## 2020-03-11 NOTE — ED Notes (Signed)
EDP at bedside to speak with patient to answer follow up questions regarding CT scan and family questions at this time.

## 2020-03-11 NOTE — ED Notes (Signed)
NAD noted at time of D/C. Pt denies questions or concerns. Pt taken to the lobby via wheelchair at this time.  

## 2020-03-11 NOTE — ED Notes (Signed)
Pt removed from O2 by EDP at this time.

## 2020-03-11 NOTE — ED Notes (Signed)
Pt given phone to speak to her son after EDP spoke with patient's family at this time.

## 2020-03-11 NOTE — ED Notes (Signed)
US at bedside

## 2020-03-11 NOTE — ED Notes (Signed)
EDP to bedside to speak with patient at this time. States will contact daughter to update daughter regarding plan of care.

## 2020-03-11 NOTE — ED Notes (Signed)
Pt sats are 88% on room air and resp rate of 18. Pt placed on 2L of O2.

## 2020-03-11 NOTE — ED Notes (Signed)
Daughter updated 

## 2020-03-26 ENCOUNTER — Other Ambulatory Visit: Payer: Self-pay

## 2020-03-26 ENCOUNTER — Ambulatory Visit (INDEPENDENT_AMBULATORY_CARE_PROVIDER_SITE_OTHER): Payer: Medicare Other

## 2020-03-26 ENCOUNTER — Ambulatory Visit (INDEPENDENT_AMBULATORY_CARE_PROVIDER_SITE_OTHER): Payer: Medicare Other | Admitting: Podiatry

## 2020-03-26 DIAGNOSIS — M79671 Pain in right foot: Secondary | ICD-10-CM

## 2020-03-26 DIAGNOSIS — M79675 Pain in left toe(s): Secondary | ICD-10-CM | POA: Diagnosis not present

## 2020-03-26 DIAGNOSIS — L989 Disorder of the skin and subcutaneous tissue, unspecified: Secondary | ICD-10-CM | POA: Diagnosis not present

## 2020-03-26 DIAGNOSIS — M79674 Pain in right toe(s): Secondary | ICD-10-CM

## 2020-03-26 DIAGNOSIS — M79672 Pain in left foot: Secondary | ICD-10-CM

## 2020-03-26 DIAGNOSIS — B351 Tinea unguium: Secondary | ICD-10-CM

## 2020-03-28 ENCOUNTER — Encounter: Payer: Self-pay | Admitting: Podiatry

## 2020-03-28 NOTE — Progress Notes (Signed)
  Subjective:  Patient ID: Tracey Jackson, female    DOB: 15-Jan-1935,  MRN: [id number]  Chief Complaint  Patient presents with  . Foot Pain    pt is here for multiple black spots on both feet, pt states that it is painful to the touch, pt is also looking for a nail trim   85 y.o. female returns for the above complaint.   Patient presents with bilateral hallux hyperkeratotic lesion/callus formation that are painful when ambulating.  Patient states that she would like to have them debrided down as is causing her a lot of pain when ambulating.  She also has thickened elongated dystrophic toenails x9.  She has not been able to debride her down herself and would like for [place] to do it.  They are painful to touch.  Especially after the been getting along.  She also has a history of right hallux amputation that was done long time ago.  Appears to be healing well without any signs of wounds  Objective:  There were no vitals filed for this visit. Podiatric Exam: Vascular: dorsalis pedis and posterior tibial pulses are palpable bilateral. Capillary return is immediate. Temperature gradient is WNL. Skin turgor WNL  Sensorium: Normal Semmes Weinstein monofilament test. Normal tactile sensation bilaterally. Nail Exam: Pt has thick disfigured discolored nails with subungual debris noted bilateral entire nail hallux through fifth toenails.  Pain on palpation to the nails. Ulcer Exam: There is no evidence of ulcer or pre-ulcerative changes or infection. Orthopedic Exam: Muscle tone and strength are WNL. No limitations in general ROM. No crepitus or effusions noted. HAV  B/L.  Hammer toes 2-5  B/L. Skin: No Porokeratosis. No infection or ulcers.  Hyperkeratotic lesion noted submetatarsal 1 bilaterally.  No pinpoint bleeding noted.  No porokeratosis noted.    Assessment & Plan:   1. Foot pain, bilateral     Patient was evaluated and treated and all questions answered.  Bilateral benign skin lesion submet first  metatarsophalangeal joint  -I explained the patient the etiology of hyperkeratotic lesion/benign skin lesion various treatment options were discussed.  Given that they are painful I believe patient will benefit from a debridement.  Using chisel blade and a handle of the lesions were aggressively debrided down to healthy striated tissue.  No pinpoint bleeding.  No complication noted.  Onychomycosis with pain  -Nails palliatively debrided as below. -Educated on self-care  Procedure: Nail Debridement Rationale: pain  Type of Debridement: manual, sharp debridement. Instrumentation: Nail nipper, rotary burr. Number of Nails: 9  Procedures and Treatment: Consent by patient was obtained for treatment procedures. The patient understood the discussion of treatment and procedures well. All questions were answered thoroughly reviewed. Debridement of mycotic and hypertrophic toenails, 1 through 5 bilateral and clearing of subungual debris. No ulceration, no infection noted.  Return Visit-Office Procedure: Patient instructed to return to the office for a follow up visit 3 months for continued evaluation and treatment.  Boneta Lucks, DPM    No follow-ups on file.

## 2020-05-06 ENCOUNTER — Other Ambulatory Visit: Payer: Self-pay | Admitting: Podiatry

## 2020-05-06 DIAGNOSIS — L989 Disorder of the skin and subcutaneous tissue, unspecified: Secondary | ICD-10-CM

## 2020-06-23 ENCOUNTER — Inpatient Hospital Stay (HOSPITAL_COMMUNITY)
Admission: EM | Admit: 2020-06-23 | Discharge: 2020-06-26 | DRG: 180 | Disposition: A | Discharge status: Home / Self Care | Payer: Medicare Other | Attending: Internal Medicine | Admitting: Internal Medicine

## 2020-06-23 ENCOUNTER — Other Ambulatory Visit: Payer: Self-pay

## 2020-06-23 ENCOUNTER — Encounter (HOSPITAL_COMMUNITY): Payer: Self-pay | Admitting: *Deleted

## 2020-06-23 DIAGNOSIS — I110 Hypertensive heart disease with heart failure: Secondary | ICD-10-CM | POA: Diagnosis present

## 2020-06-23 DIAGNOSIS — I2510 Atherosclerotic heart disease of native coronary artery without angina pectoris: Secondary | ICD-10-CM | POA: Diagnosis present

## 2020-06-23 DIAGNOSIS — Z7902 Long term (current) use of antithrombotics/antiplatelets: Secondary | ICD-10-CM

## 2020-06-23 DIAGNOSIS — C3411 Malignant neoplasm of upper lobe, right bronchus or lung: Secondary | ICD-10-CM | POA: Diagnosis not present

## 2020-06-23 DIAGNOSIS — Z955 Presence of coronary angioplasty implant and graft: Secondary | ICD-10-CM

## 2020-06-23 DIAGNOSIS — R54 Age-related physical debility: Secondary | ICD-10-CM | POA: Diagnosis present

## 2020-06-23 DIAGNOSIS — F17210 Nicotine dependence, cigarettes, uncomplicated: Secondary | ICD-10-CM | POA: Diagnosis present

## 2020-06-23 DIAGNOSIS — R0902 Hypoxemia: Secondary | ICD-10-CM | POA: Diagnosis not present

## 2020-06-23 DIAGNOSIS — D649 Anemia, unspecified: Secondary | ICD-10-CM | POA: Diagnosis present

## 2020-06-23 DIAGNOSIS — Z6822 Body mass index (BMI) 22.0-22.9, adult: Secondary | ICD-10-CM

## 2020-06-23 DIAGNOSIS — Z79899 Other long term (current) drug therapy: Secondary | ICD-10-CM

## 2020-06-23 DIAGNOSIS — C3490 Malignant neoplasm of unspecified part of unspecified bronchus or lung: Secondary | ICD-10-CM | POA: Diagnosis present

## 2020-06-23 DIAGNOSIS — J9601 Acute respiratory failure with hypoxia: Secondary | ICD-10-CM | POA: Diagnosis present

## 2020-06-23 DIAGNOSIS — I700 Atherosclerosis of aorta: Secondary | ICD-10-CM | POA: Diagnosis present

## 2020-06-23 DIAGNOSIS — E876 Hypokalemia: Secondary | ICD-10-CM | POA: Diagnosis present

## 2020-06-23 DIAGNOSIS — R64 Cachexia: Secondary | ICD-10-CM | POA: Diagnosis present

## 2020-06-23 DIAGNOSIS — Z20822 Contact with and (suspected) exposure to covid-19: Secondary | ICD-10-CM | POA: Diagnosis present

## 2020-06-23 DIAGNOSIS — K219 Gastro-esophageal reflux disease without esophagitis: Secondary | ICD-10-CM | POA: Diagnosis present

## 2020-06-23 DIAGNOSIS — Z8249 Family history of ischemic heart disease and other diseases of the circulatory system: Secondary | ICD-10-CM

## 2020-06-23 DIAGNOSIS — Z8041 Family history of malignant neoplasm of ovary: Secondary | ICD-10-CM

## 2020-06-23 DIAGNOSIS — J9819 Other pulmonary collapse: Secondary | ICD-10-CM | POA: Diagnosis present

## 2020-06-23 NOTE — ED Triage Notes (Signed)
The pt arrived by gems from home  Dx with bronchitis one week ago c/o sob tonight  No home 02  sats low when ems arrived  At present not sob  Is on nasal 02 4 liters she is still a smoker

## 2020-06-23 NOTE — ED Notes (Signed)
Daughter's phone number is [phone] - Stormy Fabian, she also has all of her medications.

## 2020-06-24 ENCOUNTER — Telehealth: Payer: Self-pay | Admitting: Internal Medicine

## 2020-06-24 ENCOUNTER — Emergency Department (HOSPITAL_COMMUNITY): Payer: Medicare Other

## 2020-06-24 ENCOUNTER — Other Ambulatory Visit: Payer: Self-pay

## 2020-06-24 ENCOUNTER — Encounter (HOSPITAL_COMMUNITY): Payer: Self-pay | Admitting: Internal Medicine

## 2020-06-24 DIAGNOSIS — R918 Other nonspecific abnormal finding of lung field: Secondary | ICD-10-CM

## 2020-06-24 DIAGNOSIS — R0902 Hypoxemia: Secondary | ICD-10-CM | POA: Diagnosis present

## 2020-06-24 LAB — BASIC METABOLIC PANEL
Anion gap: 9 (ref 5–15)
BUN: 13 mg/dL (ref 8–23)
CO2: 26 mmol/L (ref 22–32)
Calcium: 8.2 mg/dL — ABNORMAL LOW (ref 8.9–10.3)
Chloride: 105 mmol/L (ref 98–111)
Creatinine, Ser: 1.08 mg/dL — ABNORMAL HIGH (ref 0.44–1.00)
GFR calc Af Amer: 55 mL/min — ABNORMAL LOW (ref >60)
GFR calc non Af Amer: 47 mL/min — ABNORMAL LOW (ref >60)
Glucose, Bld: 148 mg/dL — ABNORMAL HIGH (ref 70–99)
Potassium: 3 mmol/L — ABNORMAL LOW (ref 3.5–5.1)
Sodium: 140 mmol/L (ref 135–145)

## 2020-06-24 LAB — CBC
HCT: 30.7 % — ABNORMAL LOW (ref 36.0–46.0)
HCT: 32.2 % — ABNORMAL LOW (ref 36.0–46.0)
Hemoglobin: 8.5 g/dL — ABNORMAL LOW (ref 12.0–15.0)
Hemoglobin: 9 g/dL — ABNORMAL LOW (ref 12.0–15.0)
MCH: 20.6 pg — ABNORMAL LOW (ref 26.0–34.0)
MCH: 20.8 pg — ABNORMAL LOW (ref 26.0–34.0)
MCHC: 27.7 g/dL — ABNORMAL LOW (ref 30.0–36.0)
MCHC: 28 g/dL — ABNORMAL LOW (ref 30.0–36.0)
MCV: 74.3 fL — ABNORMAL LOW (ref 80.0–100.0)
MCV: 74.4 fL — ABNORMAL LOW (ref 80.0–100.0)
Platelets: 372 10*3/uL (ref 150–400)
Platelets: 395 10*3/uL (ref 150–400)
RBC: 4.13 MIL/uL (ref 3.87–5.11)
RBC: 4.33 MIL/uL (ref 3.87–5.11)
RDW: 24 % — ABNORMAL HIGH (ref 11.5–15.5)
RDW: 24.1 % — ABNORMAL HIGH (ref 11.5–15.5)
WBC: 10.6 10*3/uL — ABNORMAL HIGH (ref 4.0–10.5)
WBC: 7.3 10*3/uL (ref 4.0–10.5)
nRBC: 0 % (ref 0.0–0.2)
nRBC: 0.2 % (ref 0.0–0.2)

## 2020-06-24 LAB — ALBUMIN: Albumin: 2.3 g/dL — ABNORMAL LOW (ref 3.5–5.0)

## 2020-06-24 LAB — BRAIN NATRIURETIC PEPTIDE: B Natriuretic Peptide: 506 pg/mL — ABNORMAL HIGH (ref 0.0–100.0)

## 2020-06-24 LAB — TROPONIN I (HIGH SENSITIVITY)
Troponin I (High Sensitivity): 4 ng/L (ref <18)
Troponin I (High Sensitivity): 4 ng/L (ref <18)

## 2020-06-24 LAB — CREATININE, SERUM
Creatinine, Ser: 0.98 mg/dL (ref 0.44–1.00)
GFR calc Af Amer: >60 mL/min (ref >60)
GFR calc non Af Amer: 53 mL/min — ABNORMAL LOW (ref >60)

## 2020-06-24 LAB — SARS CORONAVIRUS 2 BY RT PCR (HOSPITAL ORDER, PERFORMED IN ~~LOC~~ HOSPITAL LAB): SARS Coronavirus 2: NEGATIVE

## 2020-06-24 MED ORDER — ENOXAPARIN SODIUM 40 MG/0.4ML ~~LOC~~ SOLN
40.0000 mg | SUBCUTANEOUS | Status: DC
  Administered 2020-06-24 – 2020-06-25 (×2): 40 mg via SUBCUTANEOUS
  Filled 2020-06-24 (×2): qty 0.4

## 2020-06-24 MED ORDER — FUROSEMIDE 20 MG PO TABS
20.0000 mg | ORAL_TABLET | Freq: Every day | ORAL | Status: DC
  Administered 2020-06-24 – 2020-06-26 (×3): 20 mg via ORAL
  Filled 2020-06-24 (×3): qty 1

## 2020-06-24 MED ORDER — ACETAMINOPHEN 325 MG PO TABS
650.0000 mg | ORAL_TABLET | Freq: Four times a day (QID) | ORAL | Status: DC | PRN
  Administered 2020-06-25 – 2020-06-26 (×2): 650 mg via ORAL
  Filled 2020-06-24 (×2): qty 2

## 2020-06-24 MED ORDER — PANTOPRAZOLE SODIUM 40 MG PO TBEC
40.0000 mg | DELAYED_RELEASE_TABLET | Freq: Every day | ORAL | Status: DC
  Administered 2020-06-24 – 2020-06-26 (×3): 40 mg via ORAL
  Filled 2020-06-24 (×3): qty 1

## 2020-06-24 MED ORDER — ATORVASTATIN CALCIUM 10 MG PO TABS
10.0000 mg | ORAL_TABLET | Freq: Every day | ORAL | Status: DC
  Administered 2020-06-24 – 2020-06-25 (×2): 10 mg via ORAL
  Filled 2020-06-24 (×2): qty 1

## 2020-06-24 MED ORDER — POTASSIUM CHLORIDE CRYS ER 20 MEQ PO TBCR
40.0000 meq | EXTENDED_RELEASE_TABLET | Freq: Once | ORAL | Status: AC
  Administered 2020-06-24: 40 meq via ORAL
  Filled 2020-06-24: qty 2

## 2020-06-24 MED ORDER — ACETAMINOPHEN 650 MG RE SUPP: 650.0000 mg | Freq: Four times a day (QID) | RECTAL | Status: DC | PRN

## 2020-06-24 MED ORDER — NICOTINE 7 MG/24HR TD PT24
7.0000 mg | MEDICATED_PATCH | Freq: Every day | TRANSDERMAL | Status: DC
  Administered 2020-06-26: 7 mg via TRANSDERMAL
  Filled 2020-06-24 (×2): qty 1

## 2020-06-24 MED ORDER — ONDANSETRON HCL 4 MG/2ML IJ SOLN: 4.0000 mg | Freq: Four times a day (QID) | INTRAMUSCULAR | Status: DC | PRN

## 2020-06-24 MED ORDER — ALBUTEROL SULFATE HFA 108 (90 BASE) MCG/ACT IN AERS
2.0000 | INHALATION_SPRAY | Freq: Once | RESPIRATORY_TRACT | Status: AC
  Administered 2020-06-24: 2 via RESPIRATORY_TRACT
  Filled 2020-06-24: qty 6.7

## 2020-06-24 MED ORDER — FOLIC ACID 1 MG PO TABS
1.0000 mg | ORAL_TABLET | Freq: Every day | ORAL | Status: DC
  Administered 2020-06-24 – 2020-06-26 (×3): 1 mg via ORAL
  Filled 2020-06-24 (×3): qty 1

## 2020-06-24 MED ORDER — SODIUM CHLORIDE 0.9 % IV BOLUS
500.0000 mL | Freq: Once | INTRAVENOUS | Status: AC
  Administered 2020-06-24: 500 mL via INTRAVENOUS

## 2020-06-24 MED ORDER — POLYETHYLENE GLYCOL 3350 17 G PO PACK: 17.0000 g | PACK | Freq: Every day | ORAL | Status: DC | PRN

## 2020-06-24 MED ORDER — ONDANSETRON HCL 4 MG PO TABS: 4.0000 mg | ORAL_TABLET | Freq: Four times a day (QID) | ORAL | Status: DC | PRN

## 2020-06-24 MED ORDER — CLOPIDOGREL BISULFATE 75 MG PO TABS
75.0000 mg | ORAL_TABLET | Freq: Every day | ORAL | Status: DC
  Administered 2020-06-24 – 2020-06-25 (×2): 75 mg via ORAL
  Filled 2020-06-24 (×2): qty 1

## 2020-06-24 MED ORDER — GUAIFENESIN ER 600 MG PO TB12
600.0000 mg | ORAL_TABLET | Freq: Two times a day (BID) | ORAL | Status: DC
  Administered 2020-06-24 – 2020-06-26 (×4): 600 mg via ORAL
  Filled 2020-06-24 (×4): qty 1

## 2020-06-24 MED ORDER — AMLODIPINE BESYLATE 5 MG PO TABS
5.0000 mg | ORAL_TABLET | Freq: Every day | ORAL | Status: DC
  Administered 2020-06-24 – 2020-06-26 (×3): 5 mg via ORAL
  Filled 2020-06-24 (×4): qty 1

## 2020-06-24 MED ORDER — IOHEXOL 350 MG/ML SOLN
50.0000 mL | Freq: Once | INTRAVENOUS | Status: AC | PRN
  Administered 2020-06-24: 50 mL via INTRAVENOUS

## 2020-06-24 MED ORDER — BENZONATATE 100 MG PO CAPS
100.0000 mg | ORAL_CAPSULE | Freq: Two times a day (BID) | ORAL | Status: DC | PRN
  Administered 2020-06-25: 100 mg via ORAL
  Filled 2020-06-24: qty 1

## 2020-06-24 NOTE — Telephone Encounter (Signed)
na

## 2020-06-24 NOTE — ED Notes (Signed)
Attempted report x1. 

## 2020-06-24 NOTE — ED Notes (Signed)
Call daughter [name] at [phone] with an update

## 2020-06-24 NOTE — Consult Note (Signed)
NAME:  Tracey Jackson, MRN:  [id number], DOB:  1935/07/11, LOS: 0 ADMISSION DATE:  06/23/2020, CONSULTATION DATE:   REFERRING MD:  ER, CHIEF COMPLAINT:  SOB   Brief History   85 year old smoker presenting with SOB and enlarging lung mass.  History of present illness   85 year old smoker presenting with SOB and enlarging lung mass. Poor historian but seems dyspnea got worse a week ago. + dry cough + smoker Found by EMS to have low sats, started on O2 then brought to ER Now on no oxygen and wants to go home CTA Chest showing progressing lung cancer She was at [facility] earlier this year and declined workup  Past Medical History  Smoking CAD GERD  Significant Hospital Events   None  Consults:  Pulm  Procedures:  None  Significant Diagnostic Tests:  CTA Chest IMPRESSION: No evidence of pulmonary emboli.  Significant progression of the known right lower lobe mass with extension of soft tissue neoplasm to the level of the right hilum with significant adenopathy and neoplasm involving the region of the right hilum. This causes attenuation of the pulmonary arterial branches in the right lower lobe although no emboli are seen. Tumor ingrowth into the lower lobe bronchi is noted as well.  Changes suspicious for in growth of tumor into the lateral wall of the right atrium although difficult to evaluate on this exam. Echocardiography may be helpful as clinically indicated.  Nodular changes in the right upper lobe are noted suspicious for neoplastic involvement. Additionally collapse of the right middle lobe is noted likely related to central bronchial occlusion.  Stable changes in the kidneys consistent with cystic change.  Micro Data:  N/A  Antimicrobials:  N/A   Interim history/subjective:  Consulted  Objective   Blood pressure (!) 154/75, pulse (!) 113, temperature 98 F (36.7 C), resp. rate 20, height 5\' 3"  (1.6 m), weight 57.2 kg, SpO2 95 %.       No intake or  output data in the 24 hours ending 06/24/20 1753 Filed Weights   06/23/20 2344  Weight: 57.2 kg    Examination: General: frail elderly woman lying in bed HENT: temporal wasting, MMM Lungs: Diminished bilaterally, no wheezing Cardiovascular: Tachycardic, ext warm Abdomen: soft, +BS Extremities: no muscle wasting or stigmata of arthritis Neuro: moves all 4 ext to command Skin: no rashes  Resolved Hospital Problem list   N/A  Assessment & Plan:  Progressive cancer, likely lung- refused workup in past, got on phone with son/daughter and patient, she is now agreeable to at least biopsy and discussion regarding treatment options, unfortunately she is on plavix so she will need washout from this. - Walking pulse ox then can be discharged - Will arrange for outpatient bronch with plavix washout at later date    Labs   CBC: Recent Labs  Lab 06/23/20 2355  WBC 10.6*  HGB 9.0*  HCT 32.2*  MCV 74.4*  PLT 409    Basic Metabolic Panel: Recent Labs  Lab 06/23/20 2355  NA 140  K 3.0*  CL 105  CO2 26  GLUCOSE 148*  BUN 13  CREATININE 1.08*  CALCIUM 8.2*   GFR: Estimated Creatinine Clearance: 32.6 mL/min (A) (by C-G formula based on SCr of 1.08 mg/dL (H)). Recent Labs  Lab 06/23/20 2355  WBC 10.6*    Liver Function Tests: No results for input(s): AST, ALT, ALKPHOS, BILITOT, PROT, ALBUMIN in the last 168 hours. No results for input(s): LIPASE, AMYLASE in  the last 168 hours. No results for input(s): AMMONIA in the last 168 hours.  ABG No results found for: PHART, PCO2ART, PO2ART, HCO3, TCO2, ACIDBASEDEF, O2SAT   Coagulation Profile: No results for input(s): INR, PROTIME in the last 168 hours.  Cardiac Enzymes: No results for input(s): CKTOTAL, CKMB, CKMBINDEX, TROPONINI in the last 168 hours.  HbA1C: No results found for: HGBA1C  CBG: No results for input(s): GLUCAP in the last 168 hours.  Review of Systems:    Positive Symptoms in bold:  Constitutional  fevers, chills, weight loss, fatigue, anorexia, malaise  Eyes decreased vision, double vision, eye irritation  Ears, Nose, Mouth, Throat sore throat, trouble swallowing, sinus congestion  Cardiovascular chest pain, paroxysmal nocturnal dyspnea, lower ext edema, palpitations   Respiratory SOB, cough, DOE, hemoptysis, wheezing  Gastrointestinal nausea, vomiting, diarrhea  Genitourinary burning with urination, trouble urinating  Musculoskeletal joint aches, joint swelling, back pain  Integumentary  rashes, skin lesions  Neurological focal weakness, focal numbness, trouble speaking, headaches  Psychiatric depression, anxiety, confusion  Endocrine polyuria, polydipsia, cold intolerance, heat intolerance  Hematologic abnormal bruising, abnormal bleeding, unexplained nose bleeds  Allergic/Immunologic recurrent infections, hives, swollen lymph nodes     Surgical History   History reviewed. No pertinent surgical history.   Social History   reports that she has been smoking. She has never used smokeless tobacco. She reports that she does not drink alcohol.   Family History   No family history of lung cancer  Allergies No Known Allergies   Home Medications  Prior to Admission medications   Medication Sig Start Date End Date Taking? Authorizing Provider  amLODipine (NORVASC) 5 MG tablet Take 5 mg by mouth in the morning and at bedtime.   Yes [provider]  atorvastatin (LIPITOR) 10 MG tablet Take 10 mg by mouth at bedtime.   Yes [provider]  clopidogrel (PLAVIX) 75 MG tablet Take 75 mg by mouth daily.   Yes [provider]  folic acid (FOLVITE) 1 MG tablet Take 1 mg by mouth daily.   Yes [provider]  furosemide (LASIX) 20 MG tablet Take 20 mg by mouth daily.   Yes [provider]  loratadine-pseudoephedrine (CLARITIN-D 24 HOUR) 10-240 MG 24 hr tablet Take 1 tablet by mouth daily as needed for allergies.   Yes [provider]    pantoprazole (PROTONIX) 40 MG tablet Take 40 mg by mouth daily.   Yes [provider]

## 2020-06-24 NOTE — ED Notes (Signed)
Attempted to give report x2 

## 2020-06-24 NOTE — ED Notes (Signed)
Attempted to give reportx1 

## 2020-06-24 NOTE — ED Provider Notes (Signed)
[facility] Provider Note   CSN: [id number] Arrival date & time: 06/23/20  2318     History Chief Complaint  Patient presents with  . Shortness of Breath    Tracey Jackson is a 85 y.o. female.  HPI   85 year old female presenting to the emergency department today for evaluation of shortness of breath. States she was sitting on her couch crocheting yesterday when she had sudden onset of shortness of breath. She took Mucinex without relief. She also reports a mild cough. She denies any pleuritic pain, hemoptysis, chest pain or leg swelling. She denies any fevers rhinorrhea or other upper respiratory symptoms.  She is not typically on oxygen therapy.  Reviewed prior records, patient was diagnosed with a right lower lobe lung mass earlier this year that was highly suspicious for carcinoma. She opted not to proceed with any biopsy or other therapies at that time.  History reviewed. No pertinent past medical history.  There are no problems to display for this patient.   History reviewed. No pertinent surgical history.   OB History   No obstetric history on file.     No family history on file.  Social History   Tobacco Use  . Smoking status: Current Every Day Smoker  . Smokeless tobacco: Never Used  Substance Use Topics  . Alcohol use: Never  . Drug use: Not on file    Home Medications Prior to Admission medications   Medication Sig Start Date End Date Taking? Authorizing Provider  amLODipine (NORVASC) 5 MG tablet Take 5 mg by mouth in the morning and at bedtime.   Yes [provider]  atorvastatin (LIPITOR) 10 MG tablet Take 10 mg by mouth at bedtime.   Yes [provider]  clopidogrel (PLAVIX) 75 MG tablet Take 75 mg by mouth daily.   Yes [provider]  folic acid (FOLVITE) 1 MG tablet Take 1 mg by mouth daily.   Yes [provider]  furosemide (LASIX) 20 MG tablet Take 20 mg by mouth daily.    Yes [provider]  loratadine-pseudoephedrine (CLARITIN-D 24 HOUR) 10-240 MG 24 hr tablet Take 1 tablet by mouth daily as needed for allergies.   Yes [provider]  pantoprazole (PROTONIX) 40 MG tablet Take 40 mg by mouth daily.   Yes [provider]    Allergies    Patient has no known allergies.  Review of Systems   Review of Systems  Constitutional: Negative for fever.  HENT: Negative for ear pain and sore throat.   Eyes: Negative for pain and visual disturbance.  Respiratory: Positive for cough. Negative for shortness of breath.   Cardiovascular: Negative for chest pain and palpitations.  Gastrointestinal: Negative for abdominal pain, constipation, diarrhea, nausea and vomiting.  Genitourinary: Negative for dysuria and hematuria.  Musculoskeletal: Negative for back pain.  Skin: Negative for rash.  Neurological: Negative for headaches.  All other systems reviewed and are negative.   Physical Exam Updated Vital Signs BP (!) 154/75   Pulse (!) 113   Temp 98 F (36.7 C)   Resp 20   Ht 5\' 3"  (1.6 m)   Wt 57.2 kg   SpO2 95%   BMI 22.32 kg/m   Physical Exam Vitals and nursing note reviewed.  Constitutional:      General: She is not in acute distress.    Appearance: She is well-developed.  HENT:     Head: Normocephalic and atraumatic.  Mouth/Throat:     Mouth: Mucous membranes are dry.  Eyes:     Conjunctiva/sclera: Conjunctivae normal.  Cardiovascular:     Rate and Rhythm: Regular rhythm. Tachycardia present.     Heart sounds: No murmur heard.   Pulmonary:     Effort: Tachypnea present. No respiratory distress.     Breath sounds: Examination of the right-lower field reveals decreased breath sounds and wheezing. Decreased breath sounds and wheezing present.  Abdominal:     General: Bowel sounds are normal.     Palpations: Abdomen is soft.     Tenderness: There is no abdominal tenderness. There is no guarding or rebound.    Musculoskeletal:        General: No tenderness.     Cervical back: Neck supple.     Right lower leg: No edema.     Left lower leg: No edema.  Skin:    General: Skin is warm and dry.  Neurological:     Mental Status: She is alert.     ED Results / Procedures / Treatments   Labs (all labs ordered are listed, but only abnormal results are displayed) Labs Reviewed  BASIC METABOLIC PANEL - Abnormal; Notable for the following components:      Result Value   Potassium 3.0 (*)    Glucose, Bld 148 (*)    Creatinine, Ser 1.08 (*)    Calcium 8.2 (*)    GFR calc non Af Amer 47 (*)    GFR calc Af Amer 55 (*)    All other components within normal limits  CBC - Abnormal; Notable for the following components:   WBC 10.6 (*)    Hemoglobin 9.0 (*)    HCT 32.2 (*)    MCV 74.4 (*)    MCH 20.8 (*)    MCHC 28.0 (*)    RDW 24.0 (*)    All other components within normal limits  BRAIN NATRIURETIC PEPTIDE - Abnormal; Notable for the following components:   B Natriuretic Peptide 506.0 (*)    All other components within normal limits  SARS CORONAVIRUS 2 BY RT PCR (HOSPITAL ORDER, [facility])  TROPONIN I (HIGH SENSITIVITY)  TROPONIN I (HIGH SENSITIVITY)    EKG EKG Interpretation  Date/Time:  Wednesday June 23 2020 23:44:51 EDT Ventricular Rate:  110 PR Interval:  154 QRS Duration: 86 QT Interval:  344 QTC Calculation: 465 R Axis:   -27 Text Interpretation: Sinus tachycardia Biatrial enlargement Minimal voltage criteria for LVH, may be normal variant ( Cornell product ) Anterior infarct , age undetermined Abnormal ECG agree. extensive artifact. no old comparison Confirmed by Charlesetta Shanks [phone]) on 06/24/2020 12:58:35 PM   Radiology DG Chest 2 View  Result Date: 06/24/2020 CLINICAL DATA:  Shortness of breath EXAM: CHEST - 2 VIEW COMPARISON:  03/11/2020 FINDINGS: Cardiomegaly. Consolidation in the right lower lobe compatible with pneumonia. Mild vascular  congestion. No confluent opacity on the left. No effusions or acute bony abnormality. IMPRESSION: Cardiomegaly, vascular congestion. Right lower lobe consolidation compatible with pneumonia. Electronically Signed   By: Rolm Baptise M.D.   On: 06/24/2020 00:10   CT Angio Chest PE W and/or Wo Contrast  Result Date: 06/24/2020 CLINICAL DATA:  Shortness of breath EXAM: CT ANGIOGRAPHY CHEST WITH CONTRAST TECHNIQUE: Multidetector CT imaging of the chest was performed using the standard protocol during bolus administration of intravenous contrast. Multiplanar CT image reconstructions and MIPs were obtained to evaluate the vascular anatomy. CONTRAST:  74mL OMNIPAQUE  IOHEXOL 350 MG/ML SOLN COMPARISON:  03/11/2020 FINDINGS: Cardiovascular: Heart is mildly enlarged. Thoracic aorta demonstrates atherosclerotic calcifications without aneurysmal dilatation. Pulmonary artery shows a normal branching pattern. No filling defect to suggest pulmonary embolus is noted although some attenuation of the lower lobe branches on the right is noted secondary to the underlying mass lesion and hilar adenopathy. Axillary bypass graft is noted on the right although it is not well opacified due to timing of the contrast bolus. Mediastinum/Nodes: Thoracic inlet is within normal limits. Scattered calcified hilar and mediastinal nodes are noted consistent with prior granulomatous disease. Additionally there is central right hilar adenopathy measuring 2.3 cm in greatest dimension. Infrahilar mass density is noted which may represent a combination of neoplasm and hilar adenopathy. Lungs/Pleura: The known right lower lobe mass lesion is again identified and measures 3.7 cm. This is relatively stable from the prior exam although new soft tissue density extending from the mass centrally towards the hila is noted consistent with progression of knee plastic mass. Additionally scattered nodules are seen within the right upper lobe as well as consolidation  in the middle lobe. Bronchial plugging is noted in the right lower lobe likely related to tumor ingrowth which is new from the prior exam. Some mass effect upon the right atrium is noted best seen on images 256 through 269 of series 7. This may simply represent consolidated lung although the possibility of ingrowth into the right atrium deserves consideration. Correlation with echocardiography may be helpful. Upper Abdomen: Stable cystic changes in the kidneys are noted bilaterally but incompletely evaluated due to timing of contrast bolus. Cholelithiasis is noted. Remainder of the upper abdomen appears within normal limits. Musculoskeletal: Degenerative changes of the thoracic spine are noted. Review of the MIP images confirms the above findings. IMPRESSION: No evidence of pulmonary emboli. Significant progression of the known right lower lobe mass with extension of soft tissue neoplasm to the level of the right hilum with significant adenopathy and neoplasm involving the region of the right hilum. This causes attenuation of the pulmonary arterial branches in the right lower lobe although no emboli are seen. Tumor ingrowth into the lower lobe bronchi is noted as well. Changes suspicious for in growth of tumor into the lateral wall of the right atrium although difficult to evaluate on this exam. Echocardiography may be helpful as clinically indicated. Nodular changes in the right upper lobe are noted suspicious for neoplastic involvement. Additionally collapse of the right middle lobe is noted likely related to central bronchial occlusion. Stable changes in the kidneys consistent with cystic change. Aortic Atherosclerosis (ICD10-I70.0). Electronically Signed   By: Inez Catalina M.D.   On: 06/24/2020 16:06    Procedures Procedures (including critical care time) CRITICAL CARE Performed by: Rodney Booze   Total critical care time: 32 minutes  Critical care time was exclusive of separately billable  procedures and treating other patients.  Critical care was necessary to treat or prevent imminent or life-threatening deterioration.  Critical care was time spent personally by me on the following activities: development of treatment plan with patient and/or surrogate as well as nursing, discussions with consultants, evaluation of patient's response to treatment, examination of patient, obtaining history from patient or surrogate, ordering and performing treatments and interventions, ordering and review of laboratory studies, ordering and review of radiographic studies, pulse oximetry and re-evaluation of patient's condition.   Medications Ordered in ED Medications  albuterol (VENTOLIN HFA) 108 (90 Base) MCG/ACT inhaler 2 puff (2 puffs Inhalation Given 06/24/20  1318)  potassium chloride SA (KLOR-CON) CR tablet 40 mEq (40 mEq Oral Given 06/24/20 1319)  sodium chloride 0.9 % bolus 500 mL (500 mLs Intravenous New Bag/Given 06/24/20 1532)  iohexol (OMNIPAQUE) 350 MG/ML injection 50 mL (50 mLs Intravenous Contrast Given 06/24/20 1549)    ED Course  I have reviewed the triage vital signs and the nursing notes.  Pertinent labs & imaging results that were available during my care of the patient were reviewed by me and considered in my medical decision making (see chart for details).    MDM Rules/Calculators/A&P                          85 year old female with known right lower lobe lung mass concerning for carcinoma though not confirmed presenting for evaluation of sudden onset shortness of breath that started yesterday. Also with cough. No chest pain or pleuritic pain. No hemoptysis or leg swelling.  Reviewed/interpreted work-up CBC with mild leukocytosis, anemia present with hemoglobin at 9 BMP with slightly elevated creatinine, low potassium at 3.0 Delta troponins negative BNP elevated COVID pending at the time of admission  EKG with Sinus tachycardia Biatrial enlargement Minimal voltage criteria  for LVH, may be normal variant ( Cornell product ) Anterior infarct , age undetermined Abnormal ECG agree. extensive artifact. no old comparison  CXR reviewed/interpreted - Cardiomegaly, vascular congestion. Right lower lobe consolidation concerning for pneumonia but also could be secondary to known rll lung mass  CTA chest No evidence of pulmonary emboli. Significant progression of the known right lower lobe mass with extension of soft tissue neoplasm to the level of the right hilum with significant adenopathy and neoplasm involving the region of the right hilum. This causes attenuation of the pulmonary arterial branches in the right lower lobe although no emboli are seen. Tumor ingrowth into the lower lobe bronchi is noted as well. Changes suspicious for in growth of tumor into the lateral wall of the right atrium although difficult to evaluate on this exam. Echocardiography may be helpful as clinically indicated. Nodular changes in the right upper lobe are noted suspicious for neoplastic involvement. Additionally collapse of the right middle lobe is noted likely related to central bronchial occlusion. Stable changes in the kidneys consistent with cystic change. Aortic Atherosclerosis   Discussed findings of CT scan of the chest with the patient and discussed plan for admission.  At this time she states she is open to having evaluation by the pulmonology doctors about potential treatment options however she is undecided about following through with therapy.  4:58 PM CONSULT With Dr. Mariane Duval with pulmonology who will have the lung ca doc look at Ct and speak with fam/pt about tx options.  5:21 PM Received update from pulmonology. Pt is agreeable to undergo biopsy. They will complete this tomorrow.   5:48 PM CONSULT With Dr. Marianna Payment with internal medicine who accepts patient for admission.   Final Clinical Impression(s) / ED Diagnoses Final diagnoses:  Hypoxia    Rx / DC Orders ED Discharge Orders     None       Bishop Dublin 06/24/20 1805    Lucrezia Starch, MD 06/25/20 2232

## 2020-06-24 NOTE — H&P (Signed)
Date: 06/24/2020               Patient Name:  Tracey Jackson MRN: [id number]  DOB: 06-14-1935 Age / Sex: 85 y.o., female   PCP: Patient, No Pcp Per         Medical Service: Internal Medicine Teaching Service         Attending Physician: Dr. Jimmye Norman, Elaina Pattee, MD    First Contact: Dr. Iona Beard Pager: [phone]  Second Contact: Dr. Marianna Payment Pager: [phone]       After Hours (After 5p/  First Contact Pager: [phone]  weekends / holidays): Second Contact Pager: [phone]   Chief Complaint: Shortness of Breath  History of Present Illness:  Tracey Jackson is a 85 yo female with a past medical history of R lower lobe lung mass, CAD with MI s/p stents, GERD, and smoking presents for dyspnea that started 3 days ago. States the cough started suddenly while sitting on the couch crocheting with an associated non productive cough. States that the dyspnea has progressively gotten worse making it difficult for her to walk. She normally ambulates with a cane is not on O2 at home. She denies fever, chills, chest pain, hemoptysis, weight loss, change in appetite, nausea, vomiting, abdominal pain, diarrhea, melena or constipation.  Patient recently moved from [place] 3 months ago to live with her daughter as she felt it was unsafe to alone. She states she follow with an oncologist in [place] in the past but has not seen them since moving to [place]. Patient with CT in 03/11/2020 with right lower lobe mass for which she refused workup or biopsy.  ED course: Patient found by EMS with low O2 sats and place on 4L prior to transport to the ED. On CBC WBC10.6. hgb 9. BMP with potassium of 3.0 repleted, Cr. 1.08 with no known baseline. Troponin. Elevated BNP 506. COVID negative. EKG with sinus tachycardia with biatrial enlargement, anterior infarct. CXR with cardiomegaly, vascular congestion and RLL consolidation.  CTA without PE with progression of RLL mass with extension to the right hilum with  significant adenopathy and ingrowth into the lower lobe bronchi and possible growth into the right atrium.  Patient evaluated by PCCM with patient agree able to stent. Recommending outpatient biopsy after plavix washout.  Meds: Current Meds  Medication Sig  . amLODipine (NORVASC) 5 MG tablet Take 5 mg by mouth in the morning and at bedtime.  Marland Kitchen atorvastatin (LIPITOR) 10 MG tablet Take 10 mg by mouth at bedtime.  . clopidogrel (PLAVIX) 75 MG tablet Take 75 mg by mouth daily.  . folic acid (FOLVITE) 1 MG tablet Take 1 mg by mouth daily.  . furosemide (LASIX) 20 MG tablet Take 20 mg by mouth daily.  Marland Kitchen loratadine-pseudoephedrine (CLARITIN-D 24 HOUR) 10-240 MG 24 hr tablet Take 1 tablet by mouth daily as needed for allergies.  . pantoprazole (PROTONIX) 40 MG tablet Take 40 mg by mouth daily.     Allergies: Allergies as of 06/23/2020  . (No Known Allergies)   History reviewed. No pertinent past medical history.  Family History: history of ovarian cancer and CAD in daugther  Social History: Recently moved to [place]. 3 months ago to live with daughter. Smokes 2 -3 cigarettes a day. Smoked half a pack a day since age of 72 .  Review of Systems: A complete ROS was negative except as per HPI.   Physical Exam: Blood pressure (!) 154/75, pulse (!) 113, temperature  98 F (36.7 C), resp. rate 20, height 5\' 3"  (1.6 m), weight 57.2 kg, SpO2 95 %.  Physical Exam Constitutional:      Comments: cachectic  HENT:     Head: Normocephalic and atraumatic.     Mouth/Throat:     Mouth: Mucous membranes are moist.     Pharynx: Oropharynx is clear.  Eyes:     Extraocular Movements: Extraocular movements intact.     Pupils: Pupils are equal, round, and reactive to light.  Cardiovascular:     Rate and Rhythm: Regular rhythm. Tachycardia present.  Pulmonary:     Effort: Tachypnea present.     Breath sounds: Examination of the right-upper field reveals rhonchi. Examination of the right-middle field  reveals rhonchi. Examination of the right-lower field reveals rhonchi. Rhonchi present.     Comments: Increase work of breathing Chest:     Chest wall: No deformity or tenderness.  Abdominal:     General: Bowel sounds are normal.     Palpations: Abdomen is soft. There is no mass.  Musculoskeletal:        General: Normal range of motion.     Cervical back: Normal range of motion and neck supple.     Right lower leg: No edema.     Left lower leg: No edema.  Skin:    General: Skin is warm and dry.     Capillary Refill: Capillary refill takes less than 2 seconds.  Neurological:     General: No focal deficit present.     Mental Status: She is alert and oriented to person, place, and time.  Psychiatric:        Mood and Affect: Mood normal.        Behavior: Behavior normal.     EKG: personally reviewed my interpretation is HR 110, sinus tachycardia, no acute ischemic changes   CXR: personally reviewed my interpretation is Cardiomegaly, vascular congestion. RLL consolidation likely due to know RLL lung mass  CLINICAL DATA:  Shortness of breath  EXAM: CT ANGIOGRAPHY CHEST WITH CONTRAST  TECHNIQUE: Multidetector CT imaging of the chest was performed using the standard protocol during bolus administration of intravenous contrast. Multiplanar CT image reconstructions and MIPs were obtained to evaluate the vascular anatomy.  CONTRAST:  43mL OMNIPAQUE IOHEXOL 350 MG/ML SOLN  COMPARISON:  03/11/2020  FINDINGS: Cardiovascular: Heart is mildly enlarged. Thoracic aorta demonstrates atherosclerotic calcifications without aneurysmal dilatation. Pulmonary artery shows a normal branching pattern. No filling defect to suggest pulmonary embolus is noted although some attenuation of the lower lobe branches on the right is noted secondary to the underlying mass lesion and hilar adenopathy. Axillary bypass graft is noted on the right although it is not well opacified due to timing of the  contrast bolus.  Mediastinum/Nodes: Thoracic inlet is within normal limits. Scattered calcified hilar and mediastinal nodes are noted consistent with prior granulomatous disease. Additionally there is central right hilar adenopathy measuring 2.3 cm in greatest dimension. Infrahilar mass density is noted which may represent a combination of neoplasm and hilar adenopathy.  Lungs/Pleura: The known right lower lobe mass lesion is again identified and measures 3.7 cm. This is relatively stable from the prior exam although new soft tissue density extending from the mass centrally towards the hila is noted consistent with progression of knee plastic mass. Additionally scattered nodules are seen within the right upper lobe as well as consolidation in the middle lobe. Bronchial plugging is noted in the right lower lobe likely related to tumor ingrowth  which is new from the prior exam. Some mass effect upon the right atrium is noted best seen on images 256 through 269 of series 7. This may simply represent consolidated lung although the possibility of ingrowth into the right atrium deserves consideration. Correlation with echocardiography may be helpful.  Upper Abdomen: Stable cystic changes in the kidneys are noted bilaterally but incompletely evaluated due to timing of contrast bolus. Cholelithiasis is noted. Remainder of the upper abdomen appears within normal limits.  Musculoskeletal: Degenerative changes of the thoracic spine are noted.  Review of the MIP images confirms the above findings.  IMPRESSION: No evidence of pulmonary emboli.  Significant progression of the known right lower lobe mass with extension of soft tissue neoplasm to the level of the right hilum with significant adenopathy and neoplasm involving the region of the right hilum. This causes attenuation of the pulmonary arterial branches in the right lower lobe although no emboli are seen. Tumor ingrowth into  the lower lobe bronchi is noted as well.  Changes suspicious for in growth of tumor into the lateral wall of the right atrium although difficult to evaluate on this exam. Echocardiography may be helpful as clinically indicated.  Nodular changes in the right upper lobe are noted suspicious for neoplastic involvement. Additionally collapse of the right middle lobe is noted likely related to central bronchial occlusion.  Stable changes in the kidneys consistent with cystic change.  Aortic Atherosclerosis (ICD10-I70.0).   Electronically Signed   By: Inez Catalina M.D.   On: 06/24/2020 16:06   Assessment & Plan by Problem: Active Problems:   Hypoxia Ms. Sultan is a 85 yo female with a past medical history of R lower lobe lung mass, CAD with MI s/p stents, GERD, and smoking presents for worsening dyspnea and non productive cough who presented with hypoxia likely due to malignant airway obstruction.  Acute respiratory failure Right lower love lung mass Patient presenting for acute dyspnea with hypoxia initially requiring 4L [place] now on 2 L [place]. Patient with know right lower lobe lung mass on CT who has refused biopsy in the past. She is afebrile with minimal elevation of WBC low suspicion for infectious etiology. CXR with consolidation in the RLL. CT showing relatively stable right lower lobe mass lesion  3.7 cm wutg new soft tissue  extending from the mass centrally towards the hila is noted consistent with progression of neoplastic mass. Tumor ingrowth into the lower lobe bronchi is noted as well. Changes suspicious for in growth of tumor into the lateral wall of the right atrium. Dyspnea likely due to malignancy related airway obstruction PCCM saw patient in the ED recommending outpatient biopsy after plavix washout.  - Patient with desaturations to 89% on standing - Continue on 2L  - Will need lung biopsy however - orthostatics pending - Gauifenesin 600 BID for  cough  Hypokalemia Presenting with K of 3 repleted in the ED.  - BMP tomorrow  Anemia - hgb of 9 does not appear to have active bleeding - CBC in the morning  CAD With history of MI  Sp stents -Continue plavix   Elevated BNP Patient with possible history of CHF on home lasix of 20 mg with elevated BNP Does not appear volume overloaded on exam -Continue home lasix 20 mg. -Continue to monitor  Toabacco Use - Nicotine patch   Dispo: Admit patient to Observation with expected length of stay less than 2 midnights.  Signed: Iona Beard, MD 06/24/2020, 7:11 PM  Pager: [phone]  After 5pm on weekdays and 1pm on weekends: On Call pager: [phone]

## 2020-06-24 NOTE — ED Notes (Signed)
Pt ambulatory with assistance. Oxygen saturations quickly dropped to 89% and patient with increase in work of breathing. Pt returned to stretcher and 2L [place] reapplied.

## 2020-06-24 NOTE — ED Notes (Signed)
Report just gotten

## 2020-06-25 ENCOUNTER — Telehealth: Payer: Self-pay | Admitting: *Deleted

## 2020-06-25 ENCOUNTER — Encounter (HOSPITAL_COMMUNITY)
Admission: EM | Disposition: A | Discharge status: Home / Self Care | Payer: Self-pay | Source: Home / Self Care | Attending: Internal Medicine

## 2020-06-25 DIAGNOSIS — R64 Cachexia: Secondary | ICD-10-CM | POA: Diagnosis present

## 2020-06-25 DIAGNOSIS — C3490 Malignant neoplasm of unspecified part of unspecified bronchus or lung: Secondary | ICD-10-CM | POA: Diagnosis present

## 2020-06-25 DIAGNOSIS — Z79899 Other long term (current) drug therapy: Secondary | ICD-10-CM | POA: Diagnosis not present

## 2020-06-25 DIAGNOSIS — D649 Anemia, unspecified: Secondary | ICD-10-CM | POA: Diagnosis present

## 2020-06-25 DIAGNOSIS — I2510 Atherosclerotic heart disease of native coronary artery without angina pectoris: Secondary | ICD-10-CM | POA: Diagnosis present

## 2020-06-25 DIAGNOSIS — Z8249 Family history of ischemic heart disease and other diseases of the circulatory system: Secondary | ICD-10-CM | POA: Diagnosis not present

## 2020-06-25 DIAGNOSIS — I700 Atherosclerosis of aorta: Secondary | ICD-10-CM | POA: Diagnosis present

## 2020-06-25 DIAGNOSIS — I110 Hypertensive heart disease with heart failure: Secondary | ICD-10-CM | POA: Diagnosis present

## 2020-06-25 DIAGNOSIS — R54 Age-related physical debility: Secondary | ICD-10-CM | POA: Diagnosis present

## 2020-06-25 DIAGNOSIS — C3411 Malignant neoplasm of upper lobe, right bronchus or lung: Secondary | ICD-10-CM | POA: Diagnosis present

## 2020-06-25 DIAGNOSIS — Z6822 Body mass index (BMI) 22.0-22.9, adult: Secondary | ICD-10-CM | POA: Diagnosis not present

## 2020-06-25 DIAGNOSIS — Z7902 Long term (current) use of antithrombotics/antiplatelets: Secondary | ICD-10-CM | POA: Diagnosis not present

## 2020-06-25 DIAGNOSIS — R0902 Hypoxemia: Secondary | ICD-10-CM | POA: Diagnosis present

## 2020-06-25 DIAGNOSIS — Z20822 Contact with and (suspected) exposure to covid-19: Secondary | ICD-10-CM | POA: Diagnosis present

## 2020-06-25 DIAGNOSIS — J9819 Other pulmonary collapse: Secondary | ICD-10-CM | POA: Diagnosis present

## 2020-06-25 DIAGNOSIS — F17210 Nicotine dependence, cigarettes, uncomplicated: Secondary | ICD-10-CM | POA: Diagnosis present

## 2020-06-25 DIAGNOSIS — Z8041 Family history of malignant neoplasm of ovary: Secondary | ICD-10-CM | POA: Diagnosis not present

## 2020-06-25 DIAGNOSIS — Z955 Presence of coronary angioplasty implant and graft: Secondary | ICD-10-CM | POA: Diagnosis not present

## 2020-06-25 DIAGNOSIS — J9601 Acute respiratory failure with hypoxia: Secondary | ICD-10-CM | POA: Diagnosis present

## 2020-06-25 DIAGNOSIS — E876 Hypokalemia: Secondary | ICD-10-CM | POA: Diagnosis present

## 2020-06-25 DIAGNOSIS — R911 Solitary pulmonary nodule: Secondary | ICD-10-CM

## 2020-06-25 DIAGNOSIS — K219 Gastro-esophageal reflux disease without esophagitis: Secondary | ICD-10-CM | POA: Diagnosis present

## 2020-06-25 LAB — CBC
HCT: 31.8 % — ABNORMAL LOW (ref 36.0–46.0)
Hemoglobin: 9 g/dL — ABNORMAL LOW (ref 12.0–15.0)
MCH: 20.7 pg — ABNORMAL LOW (ref 26.0–34.0)
MCHC: 28.3 g/dL — ABNORMAL LOW (ref 30.0–36.0)
MCV: 73.1 fL — ABNORMAL LOW (ref 80.0–100.0)
Platelets: 354 10*3/uL (ref 150–400)
RBC: 4.35 MIL/uL (ref 3.87–5.11)
RDW: 24.1 % — ABNORMAL HIGH (ref 11.5–15.5)
WBC: 11.6 10*3/uL — ABNORMAL HIGH (ref 4.0–10.5)
nRBC: 0.2 % (ref 0.0–0.2)

## 2020-06-25 LAB — BASIC METABOLIC PANEL
Anion gap: 10 (ref 5–15)
BUN: 11 mg/dL (ref 8–23)
CO2: 24 mmol/L (ref 22–32)
Calcium: 8.4 mg/dL — ABNORMAL LOW (ref 8.9–10.3)
Chloride: 108 mmol/L (ref 98–111)
Creatinine, Ser: 1.03 mg/dL — ABNORMAL HIGH (ref 0.44–1.00)
GFR calc Af Amer: 58 mL/min — ABNORMAL LOW (ref >60)
GFR calc non Af Amer: 50 mL/min — ABNORMAL LOW (ref >60)
Glucose, Bld: 106 mg/dL — ABNORMAL HIGH (ref 70–99)
Potassium: 3.5 mmol/L (ref 3.5–5.1)
Sodium: 142 mmol/L (ref 135–145)

## 2020-06-25 LAB — GLUCOSE, CAPILLARY: Glucose-Capillary: 115 mg/dL — ABNORMAL HIGH (ref 70–99)

## 2020-06-25 SURGERY — BRONCHOSCOPY, WITH EBUS
Anesthesia: General | Laterality: Right

## 2020-06-25 MED ORDER — CLOPIDOGREL BISULFATE 75 MG PO TABS
75.0000 mg | ORAL_TABLET | Freq: Every day | ORAL | Status: DC
  Administered 2020-06-26: 75 mg via ORAL
  Filled 2020-06-25: qty 1

## 2020-06-25 NOTE — Progress Notes (Signed)
SATURATION QUALIFICATIONS: (This note is used to comply with regulatory documentation for home oxygen)  Patient Saturations on Room Air at Rest 97%  Patient Saturations on Room Air while Ambulating = 82%  Patient Saturations on 1 Liters of oxygen while Ambulating = 94%  Please briefly explain why patient needs home oxygen:

## 2020-06-25 NOTE — TOC Initial Note (Addendum)
Transition of Care [facility]) - Initial/Assessment Note    Patient Details  Name: Tracey Jackson MRN: [id number] Date of Birth: [birth year]  Transition of Care [facility]) CM/SW Contact:    Marilu Favre, RN Phone Number: 06/25/2020, 2:35 PM  Clinical Narrative:                 Patient from home with daughter. Patient consented for NCM to call daughter [name].   MD asked NCM to arrange home oxygen. NCM has requested saturation note and MD order. [employer] [facility] will deliver portable tank to hospital room prior to discharge.  Possible discharge tomorrow. Renee requested Percy to call her directly. Message passed onto [place] with Allison.   Patient will need PCP. SHe can call number on insurance card and be provided a list of MD in network.   Dr Sherry Ruffing will schedule follow up in clinic.   1651 Messaged MD for home oxygen order.  Expected Discharge Plan: Home/Self Care Barriers to Discharge: Continued Medical Work up   Patient Goals and CMS Choice Patient states their goals for this hospitalization and ongoing recovery are:: to go home CMS Medicare.gov Compare Post Acute Care list provided to:: Patient Choice offered to / list presented to : Patient  Expected Discharge Plan and Services Expected Discharge Plan: Home/Self Care   Discharge Planning Services: CM Consult Post Acute Care Choice: Durable Medical Equipment Living arrangements for the past 2 months: Single Family Home                 DME Arranged: Oxygen DME Agency: [employer] Date DME Agency Contacted: 06/25/20 Time DME Agency Contacted: [id number] Representative spoke with at DME Agency: Thedore Mins waiting on sat note and MD order [facility] Arranged: NA          Prior Living Arrangements/Services Living arrangements for the past 2 months: Single Family Home Lives with:: Adult Children Patient language and need for interpreter reviewed:: Yes Do you feel safe going back to the place where you live?: Yes      Need for Family  Participation in Patient Care: Yes (Comment) Care giver support system in place?: Yes (comment) Current home services: DME (walker and bedside commode) Criminal Activity/Legal Involvement Pertinent to Current Situation/Hospitalization: No - Comment as needed  Activities of Daily Living Home Assistive Devices/Equipment: Cane (specify quad or straight) ADL Screening (condition at time of admission) Patient's cognitive ability adequate to safely complete daily activities?: Yes Is the patient deaf or have difficulty hearing?: No Does the patient have difficulty seeing, even when wearing glasses/contacts?: No Does the patient have difficulty concentrating, remembering, or making decisions?: No Patient able to express need for assistance with ADLs?: Yes Does the patient have difficulty dressing or bathing?: No Independently performs ADLs?: Yes (appropriate for developmental age) Does the patient have difficulty walking or climbing stairs?: Yes Weakness of Legs: Both Weakness of Arms/Hands: None  Permission Sought/Granted   Permission granted to share information with : Yes, Verbal Permission Granted  Share Information with NAME: Renee daughter           Emotional Assessment Appearance:: Appears stated age Attitude/Demeanor/Rapport: Engaged Affect (typically observed): Accepting Orientation: : Oriented to Situation, Oriented to  Time, Oriented to Place, Oriented to Self Alcohol / Substance Use: Not Applicable Psych Involvement: No (comment)  Admission diagnosis:  Hypoxia [R09.02] Patient Active Problem List   Diagnosis Date Noted  . Hypoxia 06/24/2020   PCP:  Patient, No Pcp Per Pharmacy:   [facility] [phone] -  [place], [address] [place] [address] Phone: [phone] Fax: [phone]     Social Determinants of Health (SDOH) Interventions    Readmission Risk Interventions No  flowsheet data found.

## 2020-06-25 NOTE — Telephone Encounter (Signed)
-----   Message from Candee Furbish, MD sent at 06/24/2020  6:06 PM EDT ----- Regarding: EBUS Please schedule the following with either MH, JD, or RB  Diagnosis: Lung Mass Procedure: EBUS  Anesthesia: general Do you need Fluro? no Priority: 1 Date: Late next week or week after. Alternate Date: See above  Time: Per MH/JD/RB discretion Location: [place] Does patient have OSA? no DM? no Or Latex allergy? no Medication Restriction: no Anticoagulate/Antiplatelet: Plavix, needs washout Pre-op Labs Ordered: CBC, CMP, PT/INR, PTT Imaging request: none  Please coordinate Pre-op COVID Testing

## 2020-06-25 NOTE — Hospital Course (Signed)
Patient reports that she is feeling okay today. She reports that she is coughing up some white mucus now. When asked why she is in the hospital and she states that she knows she might have cancer. She knows she has a mass in her chest. She reports feeling awful when she heard that. She reports that "if she has it she has it". Daughter passed away from cancer, lung cancer.   She reports that she thinks she was doing okay at home, able to get herself dressed, wash herself, eat, and toilet by herself. Her daughter helps her with groceries.   Discussed that we may be able to get her home today if we can arrange some oxygen for her. Then she can follow up with pulmonology to obtain the biopsy and further evaluate the mass in her lungs.

## 2020-06-25 NOTE — Progress Notes (Signed)
   Subjective:  Patient states she is feeling better today. SOB is slightly improved. She is now abut to cough up some white sputum. Otherwise feeling well. She has good understanding of her lung mass and that is likely lung cancer, and would like to have it biopsied to learn more about the mass. Feels currently too SOB to go home today. Is ok with going home on O2.  Objective:  Vital signs in last 24 hours: Vitals:   06/25/20 0144 06/25/20 0426 06/25/20 0834 06/25/20 1327  BP: (!) 127/94 (!) 126/93 110/63 118/72  Pulse: (!) 104 (!) 102 (!) 103 94  Resp: 20 18 18 18   Temp: 98 F (36.7 C) 98.1 F (36.7 C) 98.6 F (37 C) 98 F (36.7 C)  TempSrc: Oral Oral Oral Oral  SpO2: 95% 95% 100% 100%  Weight:      Height:       Physical Exam Constitutional:      Comments: Frail appearing, resting comfortably  Pulmonary:     Effort: Pulmonary effort is normal. No respiratory distress.     Breath sounds: Examination of the right-upper field reveals wheezing. Examination of the right-middle field reveals wheezing. Examination of the right-lower field reveals wheezing. Wheezing present.  Abdominal:     Palpations: Abdomen is soft.  Skin:    General: Skin is warm and dry.  Neurological:     Mental Status: She is alert.    Assessment/Plan:  Active Problems:   Hypoxia  Ms. Riss is a 85 yo female with a past medical history of R lower lobe lung mass, CAD with MI s/p stents, HTN, CHF, GERD, and smoking presents for worsening dyspnea and non productive cough who presented with hypoxia likely due to malignant airway obstruction.  Acute respiratory failure Right lower lobe lung mass Patient resting comfortably in bed today doing well on 4L [place]. Patient would like to pursue outpatient biopsy of the mass. She is agreeable to going home on oxygen.  - Continue on 4L  - Will need lung biopsy will need plavix washout, plan for outpatient bronch with possible biospy - Gauifenesin 600 BID for  cough - Benzonatate 100 mg BID PRN for cough - TOC consulted: plan for O2 at home  Hypokalemia resolved Presenting with K of 3 no improved to 3.5 today - BMP tomorrow  Anemia - hgb stable at 9 does not appear to have active bleeding - CBC in the morning  CAD With history of MI  Sp stents -Continue plavix   Elevated BNP Patient with CHF on home lasix of 20 mg with elevated BNP 504. Does not appear volume overloaded on exam -Continue home lasix 20 mg. -Continue to monitor  Toabacco Use - Nicotine patch   Prior to Admission Living Arrangement: Home with daughter Anticipated Discharge Location: Home Barriers to Discharge: SOB, O2 Dispo: Anticipated discharge in approximately 1 day(s).   Iona Beard, MD 06/25/2020, 3:24 PM Pager: [phone] After 5pm on weekdays and 1pm on weekends: On Call pager [phone]

## 2020-06-26 LAB — CBC
HCT: 32.1 % — ABNORMAL LOW (ref 36.0–46.0)
Hemoglobin: 9.2 g/dL — ABNORMAL LOW (ref 12.0–15.0)
MCH: 20.7 pg — ABNORMAL LOW (ref 26.0–34.0)
MCHC: 28.7 g/dL — ABNORMAL LOW (ref 30.0–36.0)
MCV: 72.3 fL — ABNORMAL LOW (ref 80.0–100.0)
Platelets: 349 10*3/uL (ref 150–400)
RBC: 4.44 MIL/uL (ref 3.87–5.11)
RDW: 23.9 % — ABNORMAL HIGH (ref 11.5–15.5)
WBC: 9.3 10*3/uL (ref 4.0–10.5)
nRBC: 0 % (ref 0.0–0.2)

## 2020-06-26 LAB — BASIC METABOLIC PANEL
Anion gap: 9 (ref 5–15)
BUN: 14 mg/dL (ref 8–23)
CO2: 26 mmol/L (ref 22–32)
Calcium: 8.4 mg/dL — ABNORMAL LOW (ref 8.9–10.3)
Chloride: 105 mmol/L (ref 98–111)
Creatinine, Ser: 1.17 mg/dL — ABNORMAL HIGH (ref 0.44–1.00)
GFR calc Af Amer: 50 mL/min — ABNORMAL LOW (ref >60)
GFR calc non Af Amer: 43 mL/min — ABNORMAL LOW (ref >60)
Glucose, Bld: 107 mg/dL — ABNORMAL HIGH (ref 70–99)
Potassium: 3.9 mmol/L (ref 3.5–5.1)
Sodium: 140 mmol/L (ref 135–145)

## 2020-06-26 LAB — GLUCOSE, CAPILLARY: Glucose-Capillary: 226 mg/dL — ABNORMAL HIGH (ref 70–99)

## 2020-06-26 NOTE — Progress Notes (Signed)
Tracey Jackson be D/C'd per MD order. Discussed with the patient and all questions fully answered. ? VSS, Skin clean, dry and intact without evidence of skin break down, no evidence of skin tears noted. ? IV catheter discontinued intact. Site without signs and symptoms of complications. Dressing and pressure applied. ? An After Visit Summary was printed and given to the patient. Patient informed where to pickup prescriptions. ? D/c education completed with patient/family including follow up instructions, medication list, d/c activities limitations if indicated, with other d/c instructions as indicated by MD - patient able to verbalize understanding, all questions fully answered.  ? Patient instructed to return to ED, call 911, or call MD for any changes in condition.  ? Patient to be escorted via [place], and D/C home via private auto.

## 2020-06-26 NOTE — Progress Notes (Signed)
HD#1 Subjective:  Overnight Events: no overnight events   Patient denies new symptoms. She is resting comfortably in bed.   Objective:  Vital signs in last 24 hours: Vitals:   06/25/20 1327 06/25/20 2050 06/26/20 0501 06/26/20 1532  BP: 118/72 115/67 128/75 (!) 166/85  Pulse: 94 99 98 (!) 101  Resp: 18 16 16 20   Temp: 98 F (36.7 C) 99.1 F (37.3 C) 98.7 F (37.1 C) 98.1 F (36.7 C)  TempSrc: Oral Oral Oral Oral  SpO2: 100% 99% 100% 100%  Weight:      Height:       Supplemental O2: Room Air SpO2: 100 % O2 Flow Rate (L/min): 2 L/min   Physical Exam:  Physical Exam Constitutional:      Appearance: She is cachectic.  HENT:     Head: Normocephalic and atraumatic.  Eyes:     Extraocular Movements: Extraocular movements intact.  Cardiovascular:     Rate and Rhythm: Normal rate.     Pulses: Normal pulses.  Pulmonary:     Effort: Pulmonary effort is normal. No respiratory distress.     Breath sounds: Wheezing present.  Abdominal:     General: Abdomen is flat. Bowel sounds are normal.  Musculoskeletal:        General: Normal range of motion.     Cervical back: Normal range of motion.     Right lower leg: No edema.     Left lower leg: No edema.  Skin:    General: Skin is warm and dry.  Neurological:     General: No focal deficit present.     Mental Status: She is alert.     Filed Weights   06/23/20 2344  Weight: 57.2 kg     Intake/Output Summary (Last 24 hours) at 06/26/2020 2025 Last data filed at 06/26/2020 1700 Gross per 24 hour  Intake 700 ml  Output 0 ml  Net 700 ml   Net IO Since Admission: 1,060 mL [06/26/20 2025]  Pertinent Labs: CBC Latest Ref Rng & Units 06/26/2020 06/25/2020 06/24/2020  WBC 4.0 - 10.5 K/uL 9.3 11.6(H) 7.3  Hemoglobin 12.0 - 15.0 g/dL 9.2(L) 9.0(L) 8.5(L)  Hematocrit 36 - 46 % 32.1(L) 31.8(L) 30.7(L)  Platelets 150 - 400 K/uL 349 354 372    CMP Latest Ref Rng & Units 06/26/2020 06/25/2020 06/24/2020  Glucose 70 - 99 mg/dL  107(H) 106(H) -  BUN 8 - 23 mg/dL 14 11 -  Creatinine 0.44 - 1.00 mg/dL 1.17(H) 1.03(H) 0.98  Sodium 135 - 145 mmol/L 140 142 -  Potassium 3.5 - 5.1 mmol/L 3.9 3.5 -  Chloride 98 - 111 mmol/L 105 108 -  CO2 22 - 32 mmol/L 26 24 -  Calcium 8.9 - 10.3 mg/dL 8.4(L) 8.4(L) -    Pending Labs: none  Imaging: No results found.  Assessment/Plan:   Active Problems:   Hypoxia   Lung cancer (HCC)    has no past medical history on file.  Patient Summary:  Tracey Jackson is a 85 yo female with a past medical history of R lower lobe lung mass, CAD with MI s/p stents, HTN, CHF, GERD, and smoking presents for worsening dyspnea and non productive cough who presented with hypoxia likely due to malignant airway obstruction.  Acute respiratory failure Right lower lobe lung mass Patient will be d/c today with f/u with Pulm for outpatient biopsy. She will be discharge with HH and supplemental oxygen. Patient's daughter will be available to help her. -  D/c today  CAD With history of MI Sp stents - Discontinued plavix   Prior to Admission Living Arrangement: Home with daughter Anticipated Discharge Location: Home Barriers to Discharge: SOB, O2 Dispo: Anticipated discharge in approximately 1 day(s).  TOC recs: HH PT   Dispo: Anticipated discharge to Home today.   Marianna Payment, D.O. [facility], PGY-2 Date 06/26/2020 Time 8:25 PM Pager: [phone] Please contact the on call pager after 5 pm and on weekends at [phone].

## 2020-06-26 NOTE — Discharge Instructions (Signed)
You were hospitalized for shortness of breath. Thank you for allowing [place] to be part of your care.    Please follow up with the following providers: 1. Denice Paradise, NP (PCP) 2. [facility] - please call to make an appointment.   Please note these changes made to your medications:   - Medications to continue: All home medication with the exception of your Plavix.   - Medications to discontinue: Plavix   Please make sure to follow up with your providers.   Please call our clinic if you have any questions or concerns, we may be able to help and keep you from a long and expensive emergency room wait. Our clinic and after hours phone number is [phone], the best time to call is Monday through Friday 9 am to 4 pm but there is always someone available 24/7 if you have an emergency. If you need medication refills please notify your pharmacy one week in advance and they will send [place] a request.

## 2020-06-28 NOTE — Telephone Encounter (Signed)
Message sent to [employer] to see who needs to do this EBUS

## 2020-06-28 NOTE — Discharge Summary (Addendum)
Name: Tracey Jackson MRN: [id number] DOB: Nov 13, 1934 85 y.o. PCP: Patient, No Pcp Per  Date of Admission: 06/23/2020 11:18 PM Date of Discharge: 06/26/2020 Attending Physician: Dr. Jimmye Norman  Discharge Diagnosis: Active Problems:   Hypoxia   Lung cancer [facility])    Discharge Medications: Allergies as of 06/26/2020   No Known Allergies     Medication List    STOP taking these medications   clopidogrel 75 MG tablet Commonly known as: PLAVIX     TAKE these medications   amLODipine 5 MG tablet Commonly known as: NORVASC Take 5 mg by mouth in the morning and at bedtime.   atorvastatin 10 MG tablet Commonly known as: LIPITOR Take 10 mg by mouth at bedtime.   Claritin-D 24 Hour 10-240 MG 24 hr tablet Generic drug: loratadine-pseudoephedrine Take 1 tablet by mouth daily as needed for allergies.   folic acid 1 MG tablet Commonly known as: FOLVITE Take 1 mg by mouth daily.   furosemide 20 MG tablet Commonly known as: LASIX Take 20 mg by mouth daily.   pantoprazole 40 MG tablet Commonly known as: PROTONIX Take 40 mg by mouth daily.       Disposition and follow-up:   Tracey Jackson was discharged from [facility] in Stable condition.  At the hospital follow up visit please address:  1.  Follow-up:  A. Right lower lobe lung mass - needs f/u with pulmonology for lung biopsy     B. CAD s/p DES - was on plavix, but was discontinued at D/c  2.  Labs / imaging needed at time of follow-up: none  3.  Pending labs/ test needing follow-up: none  Follow-up Appointments:  Follow-up Information    [facility]. Schedule an appointment as soon as possible for a visit in 1 week(s).   Specialty: Pulmonology Why: Please call to make an appointment.  Contact information: [address] [place] [id number] [phone]       Marval Regal, NP. Go on 07/05/2020.   Specialty: Nurse Practitioner Why: 2:00pm  Contact  information: [address] [place] [address] [place] by problem list:  Tracey Jackson is a 85 yo female with a past medical history of R lower lobe lung mass, CAD with MI s/p stents,HTN, CHF,GERD, and smoking presents for worsening dyspnea and non productive cough who presented with hypoxia likely due to malignant airway obstruction.  Patient was admitted for desaturation with ambulation. She was ultimately discharged on home oxygen. She will need a bronchoscopy with lung biopsy with pulmonology. Plavix was d/c'd as she no longer needs this medication.   Discharge Vitals:   BP (!) 166/85 (BP Location: Right Arm)   Pulse (!) 101   Temp 98.1 F (36.7 C) (Oral)   Resp 20   Ht 5\' 1"  (1.549 m)   Wt 57.2 kg   SpO2 100%   BMI 23.81 kg/m   Pertinent Labs, Studies, and Procedures:  CBC Latest Ref Rng & Units 06/26/2020 06/25/2020 06/24/2020  WBC 4.0 - 10.5 K/uL 9.3 11.6(H) 7.3  Hemoglobin 12.0 - 15.0 g/dL 9.2(L) 9.0(L) 8.5(L)  Hematocrit 36 - 46 % 32.1(L) 31.8(L) 30.7(L)  Platelets 150 - 400 K/uL 349 354 372    CMP Latest Ref Rng & Units 06/26/2020 06/25/2020 06/24/2020  Glucose 70 - 99 mg/dL 107(H) 106(H) -  BUN 8 - 23  mg/dL 14 11 -  Creatinine 0.44 - 1.00 mg/dL 1.17(H) 1.03(H) 0.98  Sodium 135 - 145 mmol/L 140 142 -  Potassium 3.5 - 5.1 mmol/L 3.9 3.5 -  Chloride 98 - 111 mmol/L 105 108 -  CO2 22 - 32 mmol/L 26 24 -  Calcium 8.9 - 10.3 mg/dL 8.4(L) 8.4(L) -    DG Chest 2 View  Result Date: 06/24/2020 CLINICAL DATA:  Shortness of breath EXAM: CHEST - 2 VIEW COMPARISON:  03/11/2020 FINDINGS: Cardiomegaly. Consolidation in the right lower lobe compatible with pneumonia. Mild vascular congestion. No confluent opacity on the left. No effusions or acute bony abnormality. IMPRESSION: Cardiomegaly, vascular congestion. Right lower lobe consolidation compatible with pneumonia. Electronically Signed   By: Rolm Baptise M.D.   On:  06/24/2020 00:10   CT Angio Chest PE W and/or Wo Contrast  Result Date: 06/24/2020 CLINICAL DATA:  Shortness of breath EXAM: CT ANGIOGRAPHY CHEST WITH CONTRAST TECHNIQUE: Multidetector CT imaging of the chest was performed using the standard protocol during bolus administration of intravenous contrast. Multiplanar CT image reconstructions and MIPs were obtained to evaluate the vascular anatomy. CONTRAST:  51mL OMNIPAQUE IOHEXOL 350 MG/ML SOLN COMPARISON:  03/11/2020 FINDINGS: Cardiovascular: Heart is mildly enlarged. Thoracic aorta demonstrates atherosclerotic calcifications without aneurysmal dilatation. Pulmonary artery shows a normal branching pattern. No filling defect to suggest pulmonary embolus is noted although some attenuation of the lower lobe branches on the right is noted secondary to the underlying mass lesion and hilar adenopathy. Axillary bypass graft is noted on the right although it is not well opacified due to timing of the contrast bolus. Mediastinum/Nodes: Thoracic inlet is within normal limits. Scattered calcified hilar and mediastinal nodes are noted consistent with prior granulomatous disease. Additionally there is central right hilar adenopathy measuring 2.3 cm in greatest dimension. Infrahilar mass density is noted which may represent a combination of neoplasm and hilar adenopathy. Lungs/Pleura: The known right lower lobe mass lesion is again identified and measures 3.7 cm. This is relatively stable from the prior exam although new soft tissue density extending from the mass centrally towards the hila is noted consistent with progression of knee plastic mass. Additionally scattered nodules are seen within the right upper lobe as well as consolidation in the middle lobe. Bronchial plugging is noted in the right lower lobe likely related to tumor ingrowth which is new from the prior exam. Some mass effect upon the right atrium is noted best seen on images 256 through 269 of series 7. This  may simply represent consolidated lung although the possibility of ingrowth into the right atrium deserves consideration. Correlation with echocardiography may be helpful. Upper Abdomen: Stable cystic changes in the kidneys are noted bilaterally but incompletely evaluated due to timing of contrast bolus. Cholelithiasis is noted. Remainder of the upper abdomen appears within normal limits. Musculoskeletal: Degenerative changes of the thoracic spine are noted. Review of the MIP images confirms the above findings. IMPRESSION: No evidence of pulmonary emboli. Significant progression of the known right lower lobe mass with extension of soft tissue neoplasm to the level of the right hilum with significant adenopathy and neoplasm involving the region of the right hilum. This causes attenuation of the pulmonary arterial branches in the right lower lobe although no emboli are seen. Tumor ingrowth into the lower lobe bronchi is noted as well. Changes suspicious for in growth of tumor into the lateral wall of the right atrium although difficult to evaluate on this exam. Echocardiography may be helpful as clinically indicated.  Nodular changes in the right upper lobe are noted suspicious for neoplastic involvement. Additionally collapse of the right middle lobe is noted likely related to central bronchial occlusion. Stable changes in the kidneys consistent with cystic change. Aortic Atherosclerosis (ICD10-I70.0). Electronically Signed   By: Inez Catalina M.D.   On: 06/24/2020 16:06     Discharge Instructions: Discharge Instructions    Diet - low sodium heart healthy   Complete by: As directed    Increase activity slowly   Complete by: As directed       Signed: Marianna Payment, MD 06/26/2020, 7:32 AM   Pager: [phone]

## 2020-06-29 ENCOUNTER — Encounter: Payer: Self-pay | Admitting: Podiatry

## 2020-06-29 NOTE — Telephone Encounter (Signed)
Pt scheduled pt aware and staff message sent to [facility] and Tamala Julian

## 2020-06-30 ENCOUNTER — Inpatient Hospital Stay: Payer: Medicare Other | Admitting: Acute Care

## 2020-06-30 ENCOUNTER — Other Ambulatory Visit: Payer: Self-pay

## 2020-06-30 ENCOUNTER — Ambulatory Visit (INDEPENDENT_AMBULATORY_CARE_PROVIDER_SITE_OTHER): Payer: Medicare Other | Admitting: Primary Care

## 2020-06-30 ENCOUNTER — Encounter: Payer: Self-pay | Admitting: Primary Care

## 2020-06-30 VITALS — BP 110/64 | HR 104 | Temp 96.7°F | Ht 61.0 in | Wt 126.0 lb

## 2020-06-30 DIAGNOSIS — J9611 Chronic respiratory failure with hypoxia: Secondary | ICD-10-CM | POA: Diagnosis not present

## 2020-06-30 DIAGNOSIS — R918 Other nonspecific abnormal finding of lung field: Secondary | ICD-10-CM | POA: Diagnosis not present

## 2020-06-30 NOTE — Patient Instructions (Addendum)
Nice seeing you today Tracey Jackson  Recommendations: - Stay off Plavix and any other NSAID's until procedure - You are scheduled for EBUS Bronchoscopy with Dr. Lamonte Sakai on Tuesday 9/21 (please see attached, we reviewed benefits vs risk with procedure) - Continue 1L oxygen at rest and titrate 1-3L to keep between oxygen level between 90-92% on exertion   Flexible Bronchoscopy  Flexible bronchoscopy is a procedure that is used to examine the passageways in the lungs. During the procedure, a thin, flexible tool with a camera on it (bronchoscope) is passed into the mouth or nose, down through the windpipe (trachea), and into the air tubes (bronchi) in the lungs. This tool allows your health care provider to look at your lungs from the inside and take testing (diagnostic) samples if needed. Tell a health care provider about:  Any allergies you have.  All medicines you are taking, including vitamins, herbs, eye drops, creams, and over-the-counter medicines.  Any problems you or family members have had with anesthetic medicines.  Any blood disorders you have.  Any surgeries you have had.  Any medical conditions you have.  Whether you are pregnant or may be pregnant. What are the risks? Generally, this is a safe procedure. However, problems may occur, including:  Infection.  Bleeding.  Damage to other structures or organs.  Allergic reactions to medicines.  Collapsed lung (pneumothorax).  Increased need for oxygen or difficulty breathing after the procedure. What happens before the procedure? Medicines Ask your health care provider about:  Changing or stopping your regular medicines. This is especially important if you are taking diabetes medicines or blood thinners.  Taking medicines such as aspirin and ibuprofen. These medicines can thin your blood. Do not take these medicines before your procedure if your health care provider instructs you not to. You may be given antibiotic  medicine to help prevent infection. Staying hydrated Follow instructions from your health care provider about hydration, which may include:  Up to 2 hours before the procedure - you may continue to drink clear liquids, such as water, clear fruit juice, black coffee, and plain tea. Eating and drinking Follow instructions from your health care provider about eating and drinking, which may include:  8 hours before the procedure - stop eating heavy meals or foods such as meat, fried foods, or fatty foods.  6 hours before the procedure - stop eating light meals or foods, such as toast or cereal.  6 hours before the procedure - stop drinking milk or drinks that contain milk.  2 hours before the procedure - stop drinking clear liquids. General instructions  Plan to have someone take you home from the hospital or clinic.  If you will be going home right after the procedure, plan to have someone with you for 24 hours. What happens during the procedure?  To lower your risk of infection: ? Your health care team will wash or sanitize their hands. ? Your skin will be washed with soap.  An IV tube will be inserted into one of your veins.  You will be given a medicine (local anesthetic) to numb your mouth, nose, throat, and voice box (larynx). You may also be given one or more of the following: ? A medicine to help you relax (sedative). ? A medicine to control coughing. ? A medicine to dry up any fluids in your lungs (secretions).  A bronchoscope will be passed into your nose or mouth, and into your lungs. Your health care provider will examine your lungs.  Samples of airway secretions may be collected for testing.  If abnormal areas are seen in your airways, tissue samples may be removed for examination under a microscope (biopsy).  If tissue samples are needed from the outer parts of the lung, a type of X-ray (fluoroscopy) may be used to guide the bronchoscope to these areas.  If bleeding  occurs, you may be given medicine to stop or decrease the bleeding. The procedure may vary among health care providers and hospitals. What happens after the procedure?  Do not drive for 24 hours if you were given a sedative.  Your blood pressure, heart rate, breathing rate, and blood oxygen level will be monitored until the medicines you were given have worn off.  You may have a chest X-ray to check for signs of pneumothorax.  You will not be allowed to eat or drink anything for 2 hours after your procedure.  If a biopsy was taken, it is up to you to get the results of your procedure. Ask your health care provider, or the department that is doing the procedure, when your results will be ready. Summary  Flexible bronchoscopy is a procedure that allows your health care provider to look closely at your lungs from the inside and take testing (diagnostic) samples if needed.  Risks of flexible bronchoscopy include bleeding, infection, and pneumothorax.  Before a flexible bronchoscopy, you will be given a medicine (local anesthetic) to numb your mouth, nose, throat, and voice box (larynx). Then, a bronchoscope will be passed into your nose or mouth, and into your lungs.  After the procedure, your blood pressure, heart rate, breathing rate, and blood oxygen level will be monitored until the medicines you were given have worn off. You may have a chest X-ray to check for signs of pneumothorax.  You will not be allowed to eat or drink anything for 2 hours after your procedure. This information is not intended to replace advice given to you by your health care provider. Make sure you discuss any questions you have with your health care provider. Document Revised: 09/14/2017 Document Reviewed: 11/04/2016 Elsevier Patient Education  2020 [employer].

## 2020-06-30 NOTE — Progress Notes (Signed)
History of Present Illness Tracey Jackson is a 85 y.o. female current every day smoker with R. Lower lung mass, CAD with MI s/p stents, HTN, CHF,GERD. She was seen as an inpatiet for dyspnea and non-productive cough with hypoxia most likely related to malignant airway obstruction. She was admitted from 06/23/20-06/26/20 for hypoxia and lung cancer.     06/30/2020 Hospital Follow up for Hypoxia, right lower lobe mass  Tracey Jackson is a 85 yo female with a past medical history of R lower lobe lung mass, CAD with MI s/p stents,HTN, CHF,GERD, and smoking presents for worsening dyspnea and non productive cough who presented with hypoxia likely due to malignant airway obstruction. CTA Chest showing progressing lung cancer.  She was at [facility] earlier this year and declined workup. Pt. Was treated as an inpatient , seen by the pulmonary service. Dr. Tamala Julian spoke with patient's son and daughter, and she did agree to a  biopsy and discussion regarding treatment options, unfortunately she was on plavix so she  needed washout from this.( It was actually discontinued) She is scheduled for an EBUS with Dr. Lamonte Sakai 07/06/2020  Former smoker. Quit smoking 06/23/20 (60 pack year hx). Recently hospitalized for hypoxia and lung mass. She gets short of breath on exertion. She was discharged from the hospital on 1L oxygen. Since then her family has increased her oxygen d/t her occasional shortness of breath. Daughter states that she will tell them "she cant feel oxygen". Today she is on 3L oxygen sating 100%. We were able to titrated her down to 1L at rest. She has baseline congested cough which is not productive. She has lost 20 lbs in 2 months without trying. She can lie flat without issues. No PFTs on file. She is currently off plavix. She is scheduled for a EBUS bronchoscopy with lung biopsy with Dr. Lamonte Sakai on 07/06/20. No fevers, chills, sweats, chest pain, no hemoptysis.    Imaging: CTA-  No evidence of pulmonary emboli. Significant  progression of the known right lower lobe mass with extension of soft tissue neoplasm to the level of the right hilum with significant adenopathy and neoplasm involving the region of the right hilum. This causes attenuation of the pulmonary arterial branches in the right lower lobe although no emboli are seen. Tumor ingrowth into the lower lobe bronchi is noted as well. Changes suspicious for in growth of tumor into the lateral wall of the right atrium although difficult to evaluate on this exam.  Test Results: CBC Latest Ref Rng & Units 06/26/2020 06/25/2020 06/24/2020  WBC 4.0 - 10.5 K/uL 9.3 11.6(H) 7.3  Hemoglobin 12.0 - 15.0 g/dL 9.2(L) 9.0(L) 8.5(L)  Hematocrit 36 - 46 % 32.1(L) 31.8(L) 30.7(L)  Platelets 150 - 400 K/uL 349 354 372    BMP Latest Ref Rng & Units 06/26/2020 06/25/2020 06/24/2020  Glucose 70 - 99 mg/dL 107(H) 106(H) -  BUN 8 - 23 mg/dL 14 11 -  Creatinine 0.44 - 1.00 mg/dL 1.17(H) 1.03(H) 0.98  Sodium 135 - 145 mmol/L 140 142 -  Potassium 3.5 - 5.1 mmol/L 3.9 3.5 -  Chloride 98 - 111 mmol/L 105 108 -  CO2 22 - 32 mmol/L 26 24 -  Calcium 8.9 - 10.3 mg/dL 8.4(L) 8.4(L) -    BNP    Component Value Date/Time   BNP 506.0 (H) 06/24/2020 1535    ProBNP No results found for: PROBNP  PFT No results found for: FEV1PRE, FEV1POST, FVCPRE, FVCPOST, TLC, DLCOUNC, PREFEV1FVCRT, PSTFEV1FVCRT  DG Chest 2  View  Result Date: 06/24/2020 CLINICAL DATA:  Shortness of breath EXAM: CHEST - 2 VIEW COMPARISON:  03/11/2020 FINDINGS: Cardiomegaly. Consolidation in the right lower lobe compatible with pneumonia. Mild vascular congestion. No confluent opacity on the left. No effusions or acute bony abnormality. IMPRESSION: Cardiomegaly, vascular congestion. Right lower lobe consolidation compatible with pneumonia. Electronically Signed   By: Rolm Baptise M.D.   On: 06/24/2020 00:10   CT Angio Chest PE W and/or Wo Contrast  Result Date: 06/24/2020 CLINICAL DATA:  Shortness of breath EXAM: CT  ANGIOGRAPHY CHEST WITH CONTRAST TECHNIQUE: Multidetector CT imaging of the chest was performed using the standard protocol during bolus administration of intravenous contrast. Multiplanar CT image reconstructions and MIPs were obtained to evaluate the vascular anatomy. CONTRAST:  46mL OMNIPAQUE IOHEXOL 350 MG/ML SOLN COMPARISON:  03/11/2020 FINDINGS: Cardiovascular: Heart is mildly enlarged. Thoracic aorta demonstrates atherosclerotic calcifications without aneurysmal dilatation. Pulmonary artery shows a normal branching pattern. No filling defect to suggest pulmonary embolus is noted although some attenuation of the lower lobe branches on the right is noted secondary to the underlying mass lesion and hilar adenopathy. Axillary bypass graft is noted on the right although it is not well opacified due to timing of the contrast bolus. Mediastinum/Nodes: Thoracic inlet is within normal limits. Scattered calcified hilar and mediastinal nodes are noted consistent with prior granulomatous disease. Additionally there is central right hilar adenopathy measuring 2.3 cm in greatest dimension. Infrahilar mass density is noted which may represent a combination of neoplasm and hilar adenopathy. Lungs/Pleura: The known right lower lobe mass lesion is again identified and measures 3.7 cm. This is relatively stable from the prior exam although new soft tissue density extending from the mass centrally towards the hila is noted consistent with progression of knee plastic mass. Additionally scattered nodules are seen within the right upper lobe as well as consolidation in the middle lobe. Bronchial plugging is noted in the right lower lobe likely related to tumor ingrowth which is new from the prior exam. Some mass effect upon the right atrium is noted best seen on images 256 through 269 of series 7. This may simply represent consolidated lung although the possibility of ingrowth into the right atrium deserves consideration.  Correlation with echocardiography may be helpful. Upper Abdomen: Stable cystic changes in the kidneys are noted bilaterally but incompletely evaluated due to timing of contrast bolus. Cholelithiasis is noted. Remainder of the upper abdomen appears within normal limits. Musculoskeletal: Degenerative changes of the thoracic spine are noted. Review of the MIP images confirms the above findings. IMPRESSION: No evidence of pulmonary emboli. Significant progression of the known right lower lobe mass with extension of soft tissue neoplasm to the level of the right hilum with significant adenopathy and neoplasm involving the region of the right hilum. This causes attenuation of the pulmonary arterial branches in the right lower lobe although no emboli are seen. Tumor ingrowth into the lower lobe bronchi is noted as well. Changes suspicious for in growth of tumor into the lateral wall of the right atrium although difficult to evaluate on this exam. Echocardiography may be helpful as clinically indicated. Nodular changes in the right upper lobe are noted suspicious for neoplastic involvement. Additionally collapse of the right middle lobe is noted likely related to central bronchial occlusion. Stable changes in the kidneys consistent with cystic change. Aortic Atherosclerosis (ICD10-I70.0). Electronically Signed   By: Inez Catalina M.D.   On: 06/24/2020 16:06     Past medical hx Past Medical History:  Diagnosis Date  . Coronary artery disease   . Hypertension      Social History   Tobacco Use  . Smoking status: Former Smoker    Packs/day: 0.25    Years: 60.00    Pack years: 15.00    Types: Cigarettes    Quit date: 06/23/2020    Years since quitting: 0.0  . Smokeless tobacco: Never Used  Substance Use Topics  . Alcohol use: Never  . Drug use: No    Tracey Jackson reports that she quit smoking 7 days ago. Her smoking use included cigarettes. She has a 15.00 pack-year smoking history. She has never used  smokeless tobacco. She reports that she does not drink alcohol and does not use drugs.  Tobacco Cessation: Current every day smoker   Past surgical hx, Family hx, Social hx all reviewed.  Current Outpatient Medications on File Prior to Visit  Medication Sig  . amLODipine (NORVASC) 5 MG tablet Take 5 mg by mouth in the morning and at bedtime.  Marland Kitchen atorvastatin (LIPITOR) 10 MG tablet Take 10 mg by mouth at bedtime.  . ferrous sulfate 325 (65 FE) MG tablet Take 325 mg by mouth daily with breakfast.  . folic acid (FOLVITE) 1 MG tablet Take 1 mg by mouth daily.  . furosemide (LASIX) 20 MG tablet Take 20 mg by mouth daily.  Marland Kitchen loratadine-pseudoephedrine (CLARITIN-D 24 HOUR) 10-240 MG 24 hr tablet Take 1 tablet by mouth daily as needed for allergies.  . pantoprazole (PROTONIX) 40 MG tablet Take 40 mg by mouth daily.   . clopidogrel (PLAVIX) 75 MG tablet Take 75 mg by mouth daily.  (Patient not taking: Reported on 06/30/2020)  . furosemide (LASIX) 20 MG tablet Take 1 tablet (20 mg total) by mouth daily for 3 days.   No current facility-administered medications on file prior to visit.     No Known Allergies  Review Of Systems:  Constitutional:  +Weight loss. No night sweats,  Fevers, chills, fatigue, or  lassitude.  HEENT:   No headaches,  Difficulty swallowing,  Tooth/dental problems, or  Sore throat,                No sneezing, itching, ear ache, nasal congestion, post nasal drip,   CV:  No chest pain,  Orthopnea, PND, swelling in lower extremities, anasarca, dizziness, palpitations, syncope.   GI  No heartburn, indigestion, abdominal pain, nausea, vomiting, diarrhea, change in bowel habits, loss of appetite, bloody stools.   Resp: DOE. No shortness of breath at rest.  No excess mucus, no productive cough,  No non-productive cough,  No coughing up of blood.  No change in color of mucus.  No wheezing.  No chest wall deformity  Skin: no rash or lesions.  GU: no dysuria, change in color of  urine, no urgency or frequency.  No flank pain, no hematuria   MS:  No joint pain or swelling.  No decreased range of motion.  No back pain.  Psych:  No change in mood or affect. No depression or anxiety.  No memory loss.   Vital Signs BP 110/64 (BP Location: Right Arm, Cuff Size: Normal)   Pulse (!) 104   Temp (!) 96.7 F (35.9 C) (Other (Comment)) Comment (Src): wrist  Ht 5\' 1"  (1.549 m)   Wt 126 lb (57.2 kg)   SpO2 100%   BMI 23.81 kg/m    Physical Exam:  General- No distress,  A&Ox3 ENT: No sinus tenderness, TM clear, pale nasal mucosa,  no oral exudate,no post nasal drip, no LAN Cardiac: S1, S2, regular rate and rhythm, no murmur. No edema.  Chest: CTA. No wheeze/ rales/ dullness; no accessory muscle use, no nasal flaring, no sternal retractions. O2 100% on 3L.  Abd.: Soft Non-tender Ext: No clubbing cyanosis, edema Neuro:  normal strength Skin: No rashes, warm and dry Psych: normal mood and behavior   Assessment/Plan RLL lung mass concerning for malignancy Suspect recent   Hypoxemia requiring hospitalization was likely due to malignant airway obstruction. Plan - Scheduled for EBUS Bronchopscy 07/06/2020 with Dr. Lamonte Sakai - Reviewed risk and benefits of procedure including infection, bleeding, organ damage, pneumothorax, increase need for oxygen or difficulty breathing after procedure requiring re-intubation. She will likely have CXR post procedure. If biopsies are taken may takes several days to get results back. Patient has elected to proceed with procedure.  - Continue to hold Plavix until after procedure - Continue 1L oxygen at rest; 2L on exertion to maintain O2 between 88-92%  - Will need follow up post procedure/ No PFTs on file    Martyn Ehrich, NP 06/30/2020  2:55 PM

## 2020-07-01 ENCOUNTER — Other Ambulatory Visit: Payer: Self-pay

## 2020-07-02 ENCOUNTER — Other Ambulatory Visit
Admission: RE | Admit: 2020-07-02 | Discharge: 2020-07-02 | Disposition: A | Discharge status: Home / Self Care | Payer: Medicare Other | Source: Ambulatory Visit | Attending: Emergency Medicine | Admitting: Emergency Medicine

## 2020-07-02 ENCOUNTER — Other Ambulatory Visit: Payer: Self-pay

## 2020-07-02 DIAGNOSIS — Z20822 Contact with and (suspected) exposure to covid-19: Secondary | ICD-10-CM | POA: Insufficient documentation

## 2020-07-02 DIAGNOSIS — Z01812 Encounter for preprocedural laboratory examination: Secondary | ICD-10-CM | POA: Insufficient documentation

## 2020-07-03 LAB — SARS CORONAVIRUS 2 (TAT 6-24 HRS): SARS Coronavirus 2: NEGATIVE

## 2020-07-05 ENCOUNTER — Telehealth: Payer: Self-pay | Admitting: Emergency Medicine

## 2020-07-05 ENCOUNTER — Encounter (HOSPITAL_COMMUNITY): Payer: Self-pay | Admitting: Vascular Surgery

## 2020-07-05 ENCOUNTER — Other Ambulatory Visit: Payer: Self-pay

## 2020-07-05 ENCOUNTER — Ambulatory Visit: Payer: Self-pay | Admitting: Nurse Practitioner

## 2020-07-05 ENCOUNTER — Encounter (HOSPITAL_COMMUNITY): Payer: Self-pay | Admitting: Emergency Medicine

## 2020-07-05 DIAGNOSIS — I2510 Atherosclerotic heart disease of native coronary artery without angina pectoris: Secondary | ICD-10-CM

## 2020-07-05 NOTE — Progress Notes (Addendum)
Spoke with Daughter Stormy Fabian cell [phone] for PAT information. Patient just moved to area from [place]. Establishing new doctors in [place].    PCP - Denice Paradise, NP (New provider) Cardiologist - [place] [facility]ologist - Unable to provide name of MD Pulmonology - Geraldo Pitter, NP (New provider)  Chest x-ray - 06/24/20 EKG - 06/24/20 Stress Test - n/a ECHO - n/a Cardiac Cath - Yes, unknown year.  Daughter [name] says "it was 9-10 yrs ago".  Blood Thinner Instructions:  Plavix last dose was on 06/29/20 per Daughter Renee.    Anesthesia review: Yes  STOP now taking any Aspirin (unless otherwise instructed by your surgeon), Aleve, Naproxen, Ibuprofen, Motrin, Advil, Goody's, BC's, all herbal medications, fish oil, and all vitamins.   Coronavirus Screening Covid test on 07/02/20 was negative.  Daughter [name] verbalized understanding of instructions that were given via phone.

## 2020-07-05 NOTE — Anesthesia Preprocedure Evaluation (Deleted)
Anesthesia Evaluation Anesthesia Physical Anesthesia Plan  ASA:   Anesthesia Plan:    Post-op Pain Management:    Induction:   PONV Risk Score and Plan:   Airway Management Planned:   Additional Equipment:   Intra-op Plan:   Post-operative Plan:   Informed Consent:   Plan Discussed with:   Anesthesia Plan Comments: (See PAT note written 07/05/2020 by Myra Gianotti, PA-C. )        Anesthesia Quick Evaluation

## 2020-07-05 NOTE — Progress Notes (Signed)
Daughter [name] called.  They were able to locate Cardiologist name in [place].  Dr Dwyane Dee, phone [phone].  Ebony Hail, [place] was given this information.

## 2020-07-05 NOTE — Progress Notes (Signed)
Anesthesia Chart Review: [name]   Case: [id number] Date/Time: 07/06/20 1000   Procedure: VIDEO BRONCHOSCOPY WITH ENDOBRONCHIAL ULTRASOUND (N/A )   Anesthesia type: Monitor Anesthesia Care   Pre-op diagnosis: SOLITARY PULMONARY NODULE; LUNG MASS   Location: [facility]   Surgeons: Collene Gobble, MD      DISCUSSION: Patient is an 85 year old female scheduled for the above procedure.  History includes recent former smoker (quit 06/23/20), CAD/MI (s/p MI prior to 02/2015, ? Stent-->"PTCA OM1" by 05/09/16 cardiology note), HTN, HLD, lung mass (diagnosed 02/2020), dyspnea, home O2 (1-2L, initiated 06/2020), GERD, anemia (history transfusion reaction in [place]), MRSA bacteremia (03/2015), PVD (s/p remote history of right axillobifemoral graft;  s/p thrombectomy and angioplasty of the stent at the proximal right SFA ~ 03/2015 at [facility]; s/p I&D right foot wound with right great toe amputation 04/08/15; occluded left limb of Ax-bifem on 03/11/20 CTA), AAA (4.6 cm 03/11/20 CTA abd/pelvs), GI bleed (07/2018).  Patient moved earlier this year to [place] from [place]. Her daughter is Tracey Jackson (cell [phone]). Patient is still establishing with local physicians.   - Admission 01/01/20-01/03/20 for left epistaxis (in setting of ASA and Plavix), not initially responsive to [employer]. Placed on left side and ASA held. S/p PRBC for HGB 6.7.  - ED visit 03/11/20 for BLE swelling. No DVT noted, but had occlusion of what appeared to be a left FPBG, which was felt to be chronic. CXR showed a right paratracheal mass. CTA chest/abd/pelvis ordered to further evaluate chest mass and vascular graft. CTA showed a RLL spiculated mass, anterior mediastinal mass, RUL lung nodules  And right axillofemoral graft with occluded limb, right iliac occlusion with reconstitution from bypass, and 4.6 AAA.  Vascular surgeon follow-up with vascular surgeon Dr. Ronalee Belts but does not appear to have  occurred.  - Admission 06/23/20-06/26/20 for acute/subacute worsening dyspnea with known right lung mass. Low O2 sat per EMS. (ED notes indicate that patient initially opted not to proceed with lung biopsy following 02/2020 CTA.). COVID negative. HS Troponin negative x2. BNP elevated at 506. Tachycardia in the ~ 110's. Chest CTA showed significant progression of the known right lower lobe mass with extension to the level of the right hilum with significant adenopath, tumor ingrowth into the lower lobe bronchi, and question of growth of tumor into the lateral wall of the RA (consider echo if clinically indicated), nodular changes in the RUL and collapse or RML felt likely related to central bronchial occlusion. Hospitalist consulted pulmonology, and she was evaluated by Ina Homes, MD. Patient now agreeable to at least biopsy so treatment options can be discussed. Biopsy was arranged as out-patient to allow time for Plavix washout. She was discharge on home O2. Despite elevated BNP, hospitalist did not think she appeared volume overloaded and continued on home Lasix 20 mg. HGB stable at 9 and did not have any obvious active bleeding.   She had pulmonology follow-up on 06/30/20 by Eric Form, NP. By notes, no fever, chills, sweats, chest pain, or hemoptysis. EBUS Bronchoscopy scheduled for 07/06/20 with Dr. Lamonte Sakai.   Last Plavix 06/29/20. Patient initially could not remember her cardiologist's name, but finally able to contact her previous cardiology office [facility] in [place]) this afternoon. Last available stress and echo results outlined below.   Reviewed above with anesthesiologist Andres Shad, MD and Albertha Ghee, MD. Recommend cardiology weigh in given known CAD/MI, LV dysfunction (EF 40%), PAD, and smoking history. I have  discussed with Dr. Lamonte Sakai. Consider delaying procedure until cardiology evaluation--if case more urgent could consider seeing if cardiology could see following arrival to [facility] and  go from there. His office will contact patient.   Preoperative COVID-19 test was negative on 07/02/20.    VS: Ht 5\' 1"  (1.549 m)    Wt 56.7 kg    LMP  (LMP Unknown)    BMI 23.62 kg/m   BP Readings from Last 3 Encounters:  06/30/20 110/64  06/26/20 (!) 166/85  03/11/20 (!) 164/86   Pulse Readings from Last 3 Encounters:  06/30/20 (!) 104  06/26/20 (!) 101  03/11/20 74    PROVIDERS: Marval Regal, NP is PCP (new provider). Previously saw Audry Riles in [place].  Ina Homes, MD is pulmonologist - Her previous cardiologist was Edison Pace, MD in [place]. Last visit ~ 04/25/17 [phone]).   LABS: Currently, last lab results include: Lab Results  Component Value Date   WBC 9.3 06/26/2020   HGB 9.2 (L) 06/26/2020   HCT 32.1 (L) 06/26/2020   PLT 349 06/26/2020   GLUCOSE 107 (H) 06/26/2020   NA 140 06/26/2020   K 3.9 06/26/2020   CL 105 06/26/2020   CREATININE 1.17 (H) 06/26/2020   BUN 14 06/26/2020   CO2 26 06/26/2020   INR 1.1 01/01/2020     IMAGES: CTA Chest 06/24/20: IMPRESSION: - No evidence of pulmonary emboli. - Significant progression of the known right lower lobe mass with extension of soft tissue neoplasm to the level of the right hilum with significant adenopathy and neoplasm involving the region of the right hilum. This causes attenuation of the pulmonary arterial branches in the right lower lobe although no emboli are seen. Tumor ingrowth into the lower lobe bronchi is noted as well. - Changes suspicious for in growth of tumor into the lateral wall of the right atrium although difficult to evaluate on this exam. Echocardiography may be helpful as clinically indicated. - Nodular changes in the right upper lobe are noted suspicious for neoplastic involvement. Additionally collapse of the right middle lobe is noted likely related to central bronchial occlusion. - Stable changes in the kidneys consistent with cystic change. - Aortic Atherosclerosis  (ICD10-I70.0). - Axillary bypass graft is noted on the right although it is not well opacified due to timing of the contrast bolus.  CXR 06/24/20: FINDINGS: Cardiomegaly. Consolidation in the right lower lobe compatible with pneumonia. Mild vascular congestion. No confluent opacity on the left. No effusions or acute bony abnormality. IMPRESSION: Cardiomegaly, vascular congestion. Right lower lobe consolidation compatible with pneumonia.  CTA chest/abd/pelvis 03/11/20: IMPRESSION: Vascular findings: 1. No acute finding. 2. Right axillobifemoral graft with occluded left limb. Left-sided hypogastric and lower extremity arteries reconstitute from numerous well-formed collaterals. 3. Right iliac occlusion with reconstitution from the axillofemoral bypass. 4. Abdominal aortic aneurysm beginning at the renal arteries and measuring up to 4.6 cm in diameter. No available comparisons, Recommend followup by abdomen and pelvis CTA in 6 months, and vascular surgery referral/consultation if not already obtained. This recommendation follows ACR consensus guidelines: White Paper of the ACR Incidental Findings Committee II on Vascular Findings. J Am Coll Radiol 2013; 10:789-794. Aortic aneurysm NOS (ICD10-I71.9) Nonvascular findings: 1. 3.5 cm spiculated mass in the right lower lobe primarily worrisome for bronchogenic carcinoma. Right bronchial adenopathy is present. There is also an anterior mediastinal mass measuring 2.7 cm, an atypical location to reflect solitary mediastinal nodal metastasis, more likely thymic. Recommend multi disciplinary thoracic oncology follow-up. 2. There also  ground-glass opacities and nodules in the right upper lobe which could be inflammatory or neoplastic. 3. Polycystic kidneys. Solid enhancing or complex cystic lesions could be present on both sides, attention on follow-up staging scans. 4. Cholelithiasis.   EKG: EKG 06/23/20: ST at 110 bpm. Biatrial  enlargement. LVH with repolarization abnormality. Anterior infarct, age undetermined.  - Diffuse baseline artifact and some baseline wanderer which limits interpretation.   CV: BLE Venous US 03/11/20: IMPRESSION: - No femoropopliteal DVT nor evidence of DVT within the visualized calf veins. If clinical symptoms are inconsistent or if there are persistent or worsening symptoms, further imaging (possibly involving the iliac veins) may be warranted. - Incidental note made of a left lower extremity vascular graft, possibly femoral bypass graft which appears completely occluded without demonstrable color flow. Correlate with patient's surgical history.  Echo 05/21/17 ([facility]): The aortic root is normal in size.  The aortic valve is morphologically normal with normal systolic excursion.  The mitral valve is morphologically normal with normal systolic excursion.  Tricuspid valve is normal.  Pulmonic valve is not well-visualized.  Left ventricular internal dimensions are within normal limits.  Left ventricular hypertrophy is present.  Basal septal hypertrophy is present.  Hypokinesis of basal mid inferior septum is present.  Hypokinesis of the basal mid anterior septum is present.  Estimated ejection fraction of 40%.  The right heart is normal in size, thickness, and function.  Left atrium is normal in size.  Right atrium is normal in size.  There is no pericardial or pleural effusion visualized.  Nuclear stress test 03/05/15 [facility]; done as part of preoperative evaluation for vascular procedure): Impression: 1.  There is a large severe fixed myocardial perfusion defect involving most of the inferior/inferolateral wall from the cardiac apex to the base with some extension to the adjacent inferoseptal and lateral walls.  The findings are compatible with large region of previous myocardial infarct/scarring involving the segments. 2.  There is a small focus of suspected mild to  moderate reversible stress-induced ischemia involving the cardiac apex/distal segment of the anteroseptal wall as described in body of report. 3.  There is also mild to moderate hyperperfusion noted throughout the remainder the left ventricular segments including the anterior, anteroseptal, and anterolateral walls.  The relative hypoperfusion throughout the segments could be related to breast attenuation artifact.  Daughter reported that last cardiac cath was ~ 9-10 years ago.    Past Medical History:  Diagnosis Date   Anemia    Coronary artery disease    Dyspnea    Uses oxygen 2L via [place] daily   GERD (gastroesophageal reflux disease)    HLD (hyperlipidemia)    Hx of blood transfusion reaction    in [place]   Hypertension    Lung mass    Myocardial infarction ([place])    mild per daughter [name] years ago   PVD (peripheral vascular disease) ([place])    Seasonal allergies     Past Surgical History:  Procedure Laterality Date   CARDIAC SURGERY     Stent Placement   COLONOSCOPY     FOOT SURGERY Bilateral    x 2 surgeries - some toes amputated on bilateral feet per daughter [name]   VASCULAR SURGERY     Right axillobifemoral bypass, occluded left limb by 03/11/20 CTA    MEDICATIONS: No current facility-administered medications for this encounter.    amLODipine (NORVASC) 5 MG tablet   atorvastatin (LIPITOR) 10 MG tablet   clopidogrel (PLAVIX)  75 MG tablet   ferrous sulfate 325 (65 FE) MG tablet   folic acid (FOLVITE) 1 MG tablet   furosemide (LASIX) 20 MG tablet   furosemide (LASIX) 20 MG tablet   loratadine-pseudoephedrine (CLARITIN-D 24 HOUR) 10-240 MG 24 hr tablet   pantoprazole (PROTONIX) 40 MG tablet    Myra Gianotti, PA-C Surgical Short Stay/Anesthesiology [facility] Phone [phone] [facility] Phone [phone] 07/05/2020 5:01 PM

## 2020-07-05 NOTE — Telephone Encounter (Signed)
Spoke with [name] (daughter) to inform her bronch for tomorrow had been canceled per Dr. Lamonte Sakai and anesthesia. Referral was placed for cardiac referral for coronary artery disease and risk evaluation for proceeding with Bronch in future. Daughter stated understanding. Nothing further needed at the this time.   Routing to Dr. Lamonte Sakai as Juluis Rainier

## 2020-07-06 ENCOUNTER — Ambulatory Visit (HOSPITAL_COMMUNITY): Admission: RE | Admit: 2020-07-06 | Payer: Medicare Other | Source: Home / Self Care | Admitting: Emergency Medicine

## 2020-07-06 HISTORY — DX: Other nonspecific abnormal finding of lung field: R91.8

## 2020-07-06 HISTORY — DX: Acute myocardial infarction, unspecified: I21.9

## 2020-07-06 HISTORY — DX: Anemia, unspecified: D64.9

## 2020-07-06 HISTORY — DX: Peripheral vascular disease, unspecified: I73.9

## 2020-07-06 HISTORY — DX: Hyperlipidemia, unspecified: E78.5

## 2020-07-06 HISTORY — DX: Other seasonal allergic rhinitis: J30.2

## 2020-07-06 HISTORY — DX: Personal history of other specified conditions: Z87.898

## 2020-07-06 HISTORY — DX: Dyspnea, unspecified: R06.00

## 2020-07-06 HISTORY — DX: Gastro-esophageal reflux disease without esophagitis: K21.9

## 2020-07-06 SURGERY — BRONCHOSCOPY, WITH EBUS
Anesthesia: Monitor Anesthesia Care

## 2020-07-09 ENCOUNTER — Other Ambulatory Visit: Payer: Self-pay

## 2020-07-12 ENCOUNTER — Ambulatory Visit (INDEPENDENT_AMBULATORY_CARE_PROVIDER_SITE_OTHER): Payer: Medicare Other | Admitting: Nurse Practitioner

## 2020-07-12 ENCOUNTER — Encounter: Payer: Self-pay | Admitting: Nurse Practitioner

## 2020-07-12 ENCOUNTER — Other Ambulatory Visit: Payer: Self-pay

## 2020-07-12 VITALS — BP 122/80 | HR 99 | Temp 98.2°F | Ht 61.0 in

## 2020-07-12 DIAGNOSIS — R918 Other nonspecific abnormal finding of lung field: Secondary | ICD-10-CM | POA: Diagnosis not present

## 2020-07-12 DIAGNOSIS — M25551 Pain in right hip: Secondary | ICD-10-CM | POA: Diagnosis not present

## 2020-07-12 DIAGNOSIS — M7918 Myalgia, other site: Secondary | ICD-10-CM | POA: Diagnosis not present

## 2020-07-12 DIAGNOSIS — D509 Iron deficiency anemia, unspecified: Secondary | ICD-10-CM

## 2020-07-12 DIAGNOSIS — I252 Old myocardial infarction: Secondary | ICD-10-CM

## 2020-07-12 DIAGNOSIS — R102 Pelvic and perineal pain: Secondary | ICD-10-CM | POA: Diagnosis not present

## 2020-07-12 DIAGNOSIS — I10 Essential (primary) hypertension: Secondary | ICD-10-CM

## 2020-07-12 MED ORDER — FOLIC ACID 1 MG PO TABS: 1.0000 mg | ORAL_TABLET | Freq: Every day | ORAL | 0 refills | Status: AC

## 2020-07-12 MED ORDER — PANTOPRAZOLE SODIUM 40 MG PO TBEC: 40.0000 mg | DELAYED_RELEASE_TABLET | Freq: Every day | ORAL | 0 refills | Status: AC

## 2020-07-12 MED ORDER — FERROUS SULFATE 325 (65 FE) MG PO TABS: 325.0000 mg | ORAL_TABLET | Freq: Every day | ORAL | 0 refills | Status: AC

## 2020-07-12 MED ORDER — ATORVASTATIN CALCIUM 10 MG PO TABS: 10.0000 mg | ORAL_TABLET | Freq: Every day | ORAL | 0 refills | Status: AC

## 2020-07-12 MED ORDER — AMLODIPINE BESYLATE 5 MG PO TABS: 5.0000 mg | ORAL_TABLET | Freq: Two times a day (BID) | ORAL | 0 refills | Status: DC

## 2020-07-12 MED ORDER — FUROSEMIDE 20 MG PO TABS: 20.0000 mg | ORAL_TABLET | Freq: Every day | ORAL | 1 refills | Status: DC

## 2020-07-12 MED ORDER — CLARITIN-D 24 HOUR 10-240 MG PO TB24: 1.0000 | ORAL_TABLET | Freq: Every day | ORAL | 0 refills | Status: AC | PRN

## 2020-07-12 NOTE — Patient Instructions (Addendum)
Please go to the [facility] for x-rays of the hip/buttock.   For your right buttock/hip pain- try Tylenol Arthritis a directed for pain.   Please contact your lung doctor regarding directions on Plavix.  Continue with current medications at this time.  I will look into a wheelchair for home.  You are requesting additional health needs.  I will look into this as well.  Please make a follow-up office visit for complete physical exam.   Pelvic Pain, Female Pelvic pain is pain in your lower abdomen, below your belly button and between your hips. The pain may start suddenly (be acute), keep coming back (be recurring), or last a long time (become chronic). Pelvic pain that lasts longer than 6 months is considered chronic. Pelvic pain may affect your:  Reproductive organs.  Urinary system.  Digestive tract.  Musculoskeletal system. There are many potential causes of pelvic pain. Sometimes, the pain can be a result of digestive or urinary conditions, strained muscles or ligaments, or reproductive conditions. Sometimes the cause of pelvic pain is not known. Follow these instructions at home:   Take over-the-counter and prescription medicines only as told by your health care provider.  Rest as told by your health care provider.  Do not have sex if it hurts.  Keep a journal of your pelvic pain. Write down: ? When the pain started. ? Where the pain is located. ? What seems to make the pain better or worse, such as food or your period (menstrual cycle). ? Any symptoms you have along with the pain.  Keep all follow-up visits as told by your health care provider. This is important. Contact a health care provider if:  Medicine does not help your pain.  Your pain comes back.  You have new symptoms.  You have abnormal vaginal discharge or bleeding, including bleeding after menopause.  You have a fever or chills.  You are constipated.  You have blood in your  urine or stool.  You have foul-smelling urine.  You feel weak or light-headed. Get help right away if:  You have sudden severe pain.  Your pain gets steadily worse.  You have severe pain along with fever, nausea, vomiting, or excessive sweating.  You lose consciousness. Summary  Pelvic pain is pain in your lower abdomen, below your belly button and between your hips.  There are many potential causes of pelvic pain.  Keep a journal of your pelvic pain. This information is not intended to replace advice given to you by your health care provider. Make sure you discuss any questions you have with your health care provider. Document Revised: 03/20/2018 Document Reviewed: 03/20/2018 Elsevier Patient Education  [place].

## 2020-07-12 NOTE — Progress Notes (Signed)
New Patient Office Visit  Subjective:  Patient ID: Tracey Jackson, female    DOB: January 09, 1935  Age: 85 y.o. MRN: [id number]  CC:  Chief Complaint  Patient presents with  . New Patient (Initial Visit)    establish care    HPI Tracey Jackson presents to establish care with a new primary care provider. This is our first visit. Moved here from  [place] in March to be closer to [place].  She is an 85 year old patient with history of heavy tobacco addiction, right lower lung mass with concern of malignancy newly found and biopsy needed.  She has a history of CAD with MI, status post stents, peripheral artery disease with stents, hypertension, CHF, GERD, and was recently hospitalized 9/8 through 06/26/2020 for hypoxia and lung mass.  She was discharged home on continuous oxygen therapy.  She presents off Plavix therapy and they want to know whether she needs to restart it again.  She needs a lung BX for suspected RLL lung cancer and had to cancel the lung Bx because she needs Cardiology clearance on Jul 21, 2020 .Stopped smoking when found out about the lung mass.   When she moved here about March, she was able to walk outside, and do light housework.  She has noted 20 pound weight loss in the last 2 months, increasing frailty at this time.  Her daughter [name], tends to her full-time and has to sit her sister with walking to the portable potty chair, cooks cleans helps her dress and showers her.  The patient is able to brush her teeth alone.  She has been using a walker at home but it is an adequate and they are requesting a wheelchair and nurse aid help.    Patient had a fall last month in her bedroom and landed on her left side. Left hip is not bothering her. She noticed the right hip/buttock pain that started a couple weeks after the fall. She walks a little bit. She took ibuprofen 400 mg once daily for a few days and not helping.  Her med list was reviewed with [name] who is keeping up with her  medications.  Hypertension: She presents on amlodipine 5 mg daily,   CAD/stents/CHF/PAD: She presents on furosemide 20 mg daily. She has stopped  Plavix 75 mg daily.  Denies any chest pain pressure heaviness tightness  Hyperlipidemia: She presents on  atorvastatin 10 mg daily.  Denies any myalgias  History of anemia: She presents on furosemide 325 mg daily with breakfast, folic acid 1 mg daily.  Has noted no blood or melena stool.  Reports anemia has been chronic.  Allergic rhinitis: She presents on Claritin-D 1 tablet daily as needed.  Currently asymptomatic.  GERD: She presents on Protonix 40 mg daily with heartburn well controlled  Past Medical History:  Diagnosis Date  . Anemia   . Coronary artery disease   . Dyspnea    Uses oxygen 2L via [place] daily  . GERD (gastroesophageal reflux disease)   . HLD (hyperlipidemia)   . Hx of blood transfusion reaction    in [place]  . Hypertension   . Lung mass   . Myocardial infarction ([place])    mild per daughter [name] 15  years ago  . Oxygen deficiency    2L/[place] continuous after 06/23/20 admission   . PVD (peripheral vascular disease) ([place])   . Seasonal allergies     Past Surgical History:  Procedure Laterality Date  . CARDIAC SURGERY  Stent Placement  . COLONOSCOPY    . FOOT SURGERY Bilateral    x 2 surgeries - some toes amputated on bilateral feet per daughter [name]  . VASCULAR SURGERY     Right axillobifemoral bypass, occluded left limb by 03/11/20 CTA    Family History  Problem Relation Age of Onset  . Cancer Daughter   . Cancer Daughter   . Heart disease Daughter     Social History   Socioeconomic History  . Marital status: Widowed    Spouse name: Not on file  . Number of children: Not on file  . Years of education: Not on file  . Highest education level: Not on file  Occupational History  . Not on file  Tobacco Use  . Smoking status: Former Smoker    Packs/day: 0.25    Years: 60.00    Pack years: 15.00    Types:  Cigarettes    Quit date: 06/23/2020    Years since quitting: 0.0  . Smokeless tobacco: Never Used  Vaping Use  . Vaping Use: Never used  Substance and Sexual Activity  . Alcohol use: Never  . Drug use: No  . Sexual activity: Not Currently    Birth control/protection: Post-menopausal  Other Topics Concern  . Not on file  Social History Narrative   Living here now with Renee and gets up a little to get to portable pot, DTR does full care with cook, clean, showers her. The pt is ael to brush her teeth. Needs help at home.        She became dependent 2 mos ago.         Social Determinants of Health   Financial Resource Strain:   . Difficulty of Paying Living Expenses: Not on file  Food Insecurity:   . Worried About Charity fundraiser in the Last Year: Not on file  . Ran Out of Food in the Last Year: Not on file  Transportation Needs:   . Lack of Transportation (Medical): Not on file  . Lack of Transportation (Non-Medical): Not on file  Physical Activity:   . Days of Exercise per Week: Not on file  . Minutes of Exercise per Session: Not on file  Stress:   . Feeling of Stress : Not on file  Social Connections:   . Frequency of Communication with Friends and Family: Not on file  . Frequency of Social Gatherings with Friends and Family: Not on file  . Attends Religious Services: Not on file  . Active Member of Clubs or Organizations: Not on file  . Attends Archivist Meetings: Not on file  . Marital Status: Not on file  Intimate Partner Violence:   . Fear of Current or Ex-Partner: Not on file  . Emotionally Abused: Not on file  . Physically Abused: Not on file  . Sexually Abused: Not on file    Review of Systems  Constitutional: Positive for unexpected weight change. Negative for appetite change, chills and fever.  HENT: Positive for sinus pressure. Negative for congestion and ear pain.        Always takes Claritin for sinus issues- helps. No sinus infection.    Eyes: Negative.   Respiratory: Positive for cough, shortness of breath and wheezing.        No hx of COPD/emphysema . O2 dependent and SOB after hospital admission. No longer smoking. Pule Ox 99% today on O2 supplementation continuous.   Cardiovascular: Negative for chest pain, palpitations and leg  swelling.  Gastrointestinal: Negative for abdominal pain, blood in stool, constipation, nausea and vomiting.       Poor appetite. Not hungry and plans to start Ensure  Endocrine: Positive for cold intolerance. Negative for heat intolerance and polyuria.  Genitourinary: Negative for difficulty urinating, frequency and pelvic pain.  Musculoskeletal: Positive for back pain.       Right buttock severe pain- onset 1 month after fall onto left side.   Allergic/Immunologic:       Chronic sinus problems Claritin Das needed.   Neurological: Negative for dizziness, seizures, syncope, light-headedness and headaches.  Hematological: Negative for adenopathy. Does not bruise/bleed easily.  Psychiatric/Behavioral:       No concerns about anxiety/depression. No SI/HI.     Objective:   Today's Vitals: BP 122/80 (BP Location: Left Arm, Patient Position: Sitting, Cuff Size: Normal)   Pulse 99   Temp 98.2 F (36.8 C) (Oral)   Ht 5\' 1"  (1.549 m)   LMP  (LMP Unknown)   SpO2 96%   BMI 23.62 kg/m   Physical Exam Vitals reviewed.  Constitutional:      General: She is awake.     Appearance: She is cachectic.     Comments: Frail, chronically ill in appearance.  HENT:     Head: Normocephalic and atraumatic.  Cardiovascular:     Rate and Rhythm: Normal rate and regular rhythm.  Pulmonary:     Effort: Tachypnea present. No respiratory distress or retractions.     Breath sounds: Normal breath sounds and air entry.     Comments: Using O2 2L/[place] continuous. Get breathiness with speaking. She does show increase work in breathing when getting out of WC and talking several sentences.   Abdominal:     Palpations:  Abdomen is soft.     Tenderness: There is no abdominal tenderness. There is no right CVA tenderness or left CVA tenderness.  Musculoskeletal:        General: Tenderness present. No swelling or deformity.     Cervical back: Normal range of motion and neck supple.     Right lower leg: No edema.     Left lower leg: No edema.     Comments: Tender right deep buttock . No bruising or deformity. Right hip non tender with palpation, no bursa tenderness, right hip/lumbar with no bony deformity,bruising. Positive  decreased ROM hip and and lumbar back. Takes a few steps with holding onto WC, difficult time walking d/t pain.   Feet: Right hallux amputation -healed  Skin:    General: Skin is warm and dry.  Neurological:     Mental Status: She is alert and oriented to person, place, and time. Mental status is at baseline.  Psychiatric:        Mood and Affect: Mood normal.        Behavior: Behavior normal. Behavior is cooperative.     Assessment & Plan:   Problem List Items Addressed This Visit      Cardiovascular and Mediastinum   Essential hypertension   Relevant Medications   atorvastatin (LIPITOR) 10 MG tablet   furosemide (LASIX) 20 MG tablet     Respiratory   Right lower lobe lung mass     Other   History of MI (myocardial infarction)   Right hip pain   Right buttock pain - Primary   Pelvic pain   Iron deficiency anemia   Relevant Medications   ferrous sulfate 325 (65 FE) MG tablet   folic acid (FOLVITE) 1  MG tablet      Outpatient Encounter Medications as of 07/12/2020  Medication Sig  . atorvastatin (LIPITOR) 10 MG tablet Take 1 tablet (10 mg total) by mouth at bedtime.  . clopidogrel (PLAVIX) 75 MG tablet Take 75 mg by mouth daily.   . ferrous sulfate 325 (65 FE) MG tablet Take 1 tablet (325 mg total) by mouth daily with breakfast.  . folic acid (FOLVITE) 1 MG tablet Take 1 tablet (1 mg total) by mouth daily.  . furosemide (LASIX) 20 MG tablet Take 1 tablet (20 mg total)  by mouth daily.  Marland Kitchen loratadine-pseudoephedrine (CLARITIN-D 24 HOUR) 10-240 MG 24 hr tablet Take 1 tablet by mouth daily as needed for allergies.  . pantoprazole (PROTONIX) 40 MG tablet Take 1 tablet (40 mg total) by mouth daily.  . [DISCONTINUED] amLODipine (NORVASC) 5 MG tablet Take 1 tablet (5 mg total) by mouth in the morning and at bedtime.  . [DISCONTINUED] amLODipine (NORVASC) 5 MG tablet Take 5 mg by mouth in the morning and at bedtime.  . [DISCONTINUED] atorvastatin (LIPITOR) 10 MG tablet Take 10 mg by mouth at bedtime.  . [DISCONTINUED] ferrous sulfate 325 (65 FE) MG tablet Take 325 mg by mouth daily with breakfast.  . [DISCONTINUED] folic acid (FOLVITE) 1 MG tablet Take 1 mg by mouth daily.  . [DISCONTINUED] furosemide (LASIX) 20 MG tablet Take 20 mg by mouth daily.  . [DISCONTINUED] loratadine-pseudoephedrine (CLARITIN-D 24 HOUR) 10-240 MG 24 hr tablet Take 1 tablet by mouth daily as needed for allergies.  . [DISCONTINUED] pantoprazole (PROTONIX) 40 MG tablet Take 40 mg by mouth daily.   . [DISCONTINUED] furosemide (LASIX) 20 MG tablet Take 1 tablet (20 mg total) by mouth daily for 3 days.   No facility-administered encounter medications on file as of 07/12/2020.   Please go to the [facility] for x-rays of the hip/buttock.   For your right buttock/hip pain- try Tylenol Arthritis a directed for pain.   Please contact your lung doctor regarding directions on Plavix.  Continue with current medications at this time.  I will look into a wheelchair for home.  You are requesting additional health needs.  I will look into this as well.  Please make a follow-up office visit for complete physical exam.  Follow-up: Return in about 1 month (around 08/11/2020).  This visit occurred during the SARS-CoV-2 public health emergency.  Safety protocols were in place, including screening questions prior to the visit, additional usage of staff PPE, and extensive cleaning of exam  room while observing appropriate contact time as indicated for disinfecting solutions.   Denice Paradise, NP

## 2020-07-13 ENCOUNTER — Ambulatory Visit
Admission: RE | Admit: 2020-07-13 | Discharge: 2020-07-13 | Disposition: A | Discharge status: Home / Self Care | Payer: Medicare Other | Source: Ambulatory Visit | Attending: Nurse Practitioner | Admitting: Nurse Practitioner

## 2020-07-13 ENCOUNTER — Other Ambulatory Visit: Payer: Self-pay | Admitting: Nurse Practitioner

## 2020-07-13 ENCOUNTER — Ambulatory Visit
Admission: RE | Admit: 2020-07-13 | Discharge: 2020-07-13 | Disposition: A | Discharge status: Home / Self Care | Payer: Medicare Other | Attending: Nurse Practitioner | Admitting: Nurse Practitioner

## 2020-07-13 DIAGNOSIS — R0902 Hypoxemia: Secondary | ICD-10-CM

## 2020-07-13 DIAGNOSIS — I252 Old myocardial infarction: Secondary | ICD-10-CM

## 2020-07-13 DIAGNOSIS — R102 Pelvic and perineal pain: Secondary | ICD-10-CM | POA: Insufficient documentation

## 2020-07-13 DIAGNOSIS — R918 Other nonspecific abnormal finding of lung field: Secondary | ICD-10-CM

## 2020-07-13 DIAGNOSIS — M25551 Pain in right hip: Secondary | ICD-10-CM

## 2020-07-13 DIAGNOSIS — M7918 Myalgia, other site: Secondary | ICD-10-CM

## 2020-07-14 ENCOUNTER — Telehealth: Payer: Self-pay | Admitting: Nurse Practitioner

## 2020-07-14 ENCOUNTER — Ambulatory Visit: Admission: RE | Admit: 2020-07-14 | Payer: Medicare Other | Source: Ambulatory Visit

## 2020-07-14 DIAGNOSIS — D509 Iron deficiency anemia, unspecified: Secondary | ICD-10-CM | POA: Insufficient documentation

## 2020-07-14 DIAGNOSIS — R918 Other nonspecific abnormal finding of lung field: Secondary | ICD-10-CM | POA: Insufficient documentation

## 2020-07-14 NOTE — Telephone Encounter (Signed)
Stat referral was placed for pt for a CT. No auth req. Pt was scheduled immediately. I called pt to schedule appt no answer. Provider was not in the office.

## 2020-07-15 ENCOUNTER — Telehealth: Payer: Self-pay | Admitting: Nurse Practitioner

## 2020-07-15 ENCOUNTER — Other Ambulatory Visit: Payer: Self-pay

## 2020-07-15 ENCOUNTER — Ambulatory Visit
Admission: RE | Admit: 2020-07-15 | Discharge: 2020-07-15 | Disposition: A | Discharge status: Home / Self Care | Payer: Medicare Other | Source: Ambulatory Visit | Attending: Nurse Practitioner | Admitting: Nurse Practitioner

## 2020-07-15 ENCOUNTER — Emergency Department: Payer: Medicare Other

## 2020-07-15 ENCOUNTER — Telehealth: Payer: Self-pay

## 2020-07-15 DIAGNOSIS — M9984 Other biomechanical lesions of sacral region: Secondary | ICD-10-CM | POA: Diagnosis not present

## 2020-07-15 DIAGNOSIS — M25551 Pain in right hip: Secondary | ICD-10-CM | POA: Diagnosis present

## 2020-07-15 DIAGNOSIS — Z79899 Other long term (current) drug therapy: Secondary | ICD-10-CM | POA: Diagnosis not present

## 2020-07-15 DIAGNOSIS — I10 Essential (primary) hypertension: Secondary | ICD-10-CM | POA: Insufficient documentation

## 2020-07-15 DIAGNOSIS — Z87891 Personal history of nicotine dependence: Secondary | ICD-10-CM | POA: Insufficient documentation

## 2020-07-15 DIAGNOSIS — C3431 Malignant neoplasm of lower lobe, right bronchus or lung: Secondary | ICD-10-CM | POA: Diagnosis not present

## 2020-07-15 DIAGNOSIS — Z20822 Contact with and (suspected) exposure to covid-19: Secondary | ICD-10-CM | POA: Insufficient documentation

## 2020-07-15 DIAGNOSIS — I2510 Atherosclerotic heart disease of native coronary artery without angina pectoris: Secondary | ICD-10-CM | POA: Insufficient documentation

## 2020-07-15 DIAGNOSIS — C7951 Secondary malignant neoplasm of bone: Secondary | ICD-10-CM | POA: Diagnosis not present

## 2020-07-15 DIAGNOSIS — C3490 Malignant neoplasm of unspecified part of unspecified bronchus or lung: Secondary | ICD-10-CM | POA: Insufficient documentation

## 2020-07-15 DIAGNOSIS — G893 Neoplasm related pain (acute) (chronic): Secondary | ICD-10-CM | POA: Diagnosis not present

## 2020-07-15 NOTE — Telephone Encounter (Signed)
[facility] called with report from CT pelvis:   No fracture, large soft tissue mass within right half of sacrum and in the iliac bone. Likely myeloma; bx recommended.

## 2020-07-15 NOTE — Telephone Encounter (Signed)
The patient is in a lot of right hip/back pain and will go to the ED now for pain management.    I had discussed the CT report of the mass in the sacrum and the bony destruction with her  DTR Renee.  The radiologist mentioned a biopsy to rule out myeloma.  She also has a  large right lower lung mass suspicious for malignancy.     Renee  reports that her mother is deteriorating in strength and ability to walk with having more pain daily. I advised hospital admission for IV pain management to get it under control. They all agree this is the best course. Report called to ER.

## 2020-07-15 NOTE — Telephone Encounter (Signed)
I called Tracey Jackson, the daughter who is takaing care of Tracey Jackson.  I discussed the  CT report of mass in the sacrum and bony destruction. A biopsy to r/o myeloma is recommended. This is likely malignancy. Tracey Jackson is in severe pain today.   I advised hospital admission for IV pain management which is her best option. Oncology can see her there. Tracey Jackson understands how serious her mother's problem is with 2 sites that show signs of cancer. Tracey Jackson is going to talk to her mother and her brother and try to convince her mother to be admitted.   PLAN:Tracey Jackson will call me back and let me know what they want to do about admission.   If she declines, then I will place an urgent referral in to Dr. Rogue Bussing. I spoke with Dr. B about this case as he is on call today. We both think that admission and involvement with Hospice is the best option. I did not introduce the topic of Hospice to the patient or family .   CLINICAL DATA:  Fall 1 month ago with pelvic pain, initial encounter  EXAM: CT PELVIS WITHOUT CONTRAST  TECHNIQUE: Multidetector CT imaging of the pelvis was performed following the standard protocol without intravenous contrast.  COMPARISON:  03/11/2020  FINDINGS: Urinary Tract: Bilateral renal cystic changes are noted. Several of these are hyperdense and show calcification. Bladder is decompressed.  Bowel: No obstructive or inflammatory changes of the bowel are noted.  Vascular/Lymphatic: Atherosclerotic calcifications are seen. Right iliac stenting is noted. Additionally, axillo to bifemoral bypass graft is seen similar to that noted on the prior exam. Dilatation of the abdominal aorta is noted stable from the prior study.  Reproductive: Multiple calcified uterine fibroids are noted. No adnexal mass is noted.  Other:  None.  Musculoskeletal: There is a soft tissue mass centered in the right half of the sacrum with underlying destruction of the sacrum as  well as the medial aspect of the iliac bone. Multiple small lytic lesions are noted throughout the bony structures. These changes are suspicious for underlying myeloma and are new from the prior exam.  IMPRESSION: No acute fracture is identified.  There is however a large soft tissue mass within the right half of the sacrum and extending into the right iliac bone which measures approximately 5.3 cm in greatest dimension. Although this likely represents myeloma possibility of metastatic disease deserves consideration as well. Biopsy is recommended for further evaluation. These changes are new from the prior CT examination from 03/11/2020  Chronic changes as described  These results will be called to the ordering clinician or representative by the Radiologist Assistant, and communication documented in the PACS or [employer].   Electronically Signed   By: Inez Catalina M.D.   On: 07/15/2020 14:54

## 2020-07-15 NOTE — Telephone Encounter (Signed)
For Home health for an assistant you would need to place a home health referral.

## 2020-07-15 NOTE — ED Triage Notes (Signed)
Pt brought in by daughter for co right hip pain. States fell 3 weeks ago and has not been able to walk since. Did see pmd recently who did xrays and was called today to come here to be seen. Pt unsure of when she had xrays done or why she was told to come by pmd. Pt has no other concerns at this time, and no new injury.

## 2020-07-15 NOTE — Telephone Encounter (Signed)
I called her care giver- DTR [name] and she will take the pt to [facility] today for further evaluation and management of her pain.   The radiologist and I discussed her negative hip/pelvic Xray- but think it is inadequate to answer the question of whether she has a sacral/pelvic fracture. Radiology recommended CT of pelvis without contrast.   I have placed a DME order in for wheel chair  as she has had increasing debility over the last few months and is getting a RLL lung mass evaluated for suspected lung malignancy. She has hypoxia and is on continuous oxygen therapy. This DME order should be in process. [name] is requesting home health assistance as she does the ADLs for her mother and needs help in the home.   Kathy: Can you: 1. Check the the [facility] request is  correctly ordered and in process? 2. Advise on how to access a home health assistant?

## 2020-07-16 ENCOUNTER — Emergency Department
Admission: EM | Admit: 2020-07-16 | Discharge: 2020-07-16 | Disposition: A | Discharge status: Home / Self Care | Payer: Medicare Other | Attending: Student in an Organized Health Care Education/Training Program | Admitting: Student in an Organized Health Care Education/Training Program

## 2020-07-16 ENCOUNTER — Emergency Department: Payer: Medicare Other

## 2020-07-16 ENCOUNTER — Telehealth: Payer: Self-pay | Admitting: Internal Medicine

## 2020-07-16 DIAGNOSIS — M533 Sacrococcygeal disorders, not elsewhere classified: Secondary | ICD-10-CM

## 2020-07-16 DIAGNOSIS — C7951 Secondary malignant neoplasm of bone: Secondary | ICD-10-CM

## 2020-07-16 DIAGNOSIS — M25551 Pain in right hip: Secondary | ICD-10-CM

## 2020-07-16 DIAGNOSIS — R102 Pelvic and perineal pain: Secondary | ICD-10-CM

## 2020-07-16 DIAGNOSIS — M7918 Myalgia, other site: Secondary | ICD-10-CM

## 2020-07-16 DIAGNOSIS — Z87891 Personal history of nicotine dependence: Secondary | ICD-10-CM

## 2020-07-16 DIAGNOSIS — C3431 Malignant neoplasm of lower lobe, right bronchus or lung: Secondary | ICD-10-CM

## 2020-07-16 DIAGNOSIS — G893 Neoplasm related pain (acute) (chronic): Secondary | ICD-10-CM

## 2020-07-16 LAB — COMPREHENSIVE METABOLIC PANEL
ALT: 12 U/L (ref 0–44)
AST: 16 U/L (ref 15–41)
Albumin: 2.5 g/dL — ABNORMAL LOW (ref 3.5–5.0)
Alkaline Phosphatase: 102 U/L (ref 38–126)
Anion gap: 7 (ref 5–15)
BUN: 21 mg/dL (ref 8–23)
CO2: 28 mmol/L (ref 22–32)
Calcium: 9 mg/dL (ref 8.9–10.3)
Chloride: 103 mmol/L (ref 98–111)
Creatinine, Ser: 1 mg/dL (ref 0.44–1.00)
GFR calc Af Amer: >60 mL/min (ref >60)
GFR calc non Af Amer: 52 mL/min — ABNORMAL LOW (ref >60)
Glucose, Bld: 124 mg/dL — ABNORMAL HIGH (ref 70–99)
Potassium: 3.3 mmol/L — ABNORMAL LOW (ref 3.5–5.1)
Sodium: 138 mmol/L (ref 135–145)
Total Bilirubin: 0.6 mg/dL (ref 0.3–1.2)
Total Protein: 8.1 g/dL (ref 6.5–8.1)

## 2020-07-16 LAB — CBC WITH DIFFERENTIAL/PLATELET
Abs Immature Granulocytes: 0.07 10*3/uL (ref 0.00–0.07)
Basophils Absolute: 0 10*3/uL (ref 0.0–0.1)
Basophils Relative: 0 %
Eosinophils Absolute: 0.1 10*3/uL (ref 0.0–0.5)
Eosinophils Relative: 1 %
HCT: 29.2 % — ABNORMAL LOW (ref 36.0–46.0)
Hemoglobin: 8.1 g/dL — ABNORMAL LOW (ref 12.0–15.0)
Immature Granulocytes: 1 %
Lymphocytes Relative: 9 %
Lymphs Abs: 0.8 10*3/uL (ref 0.7–4.0)
MCH: 19.8 pg — ABNORMAL LOW (ref 26.0–34.0)
MCHC: 27.7 g/dL — ABNORMAL LOW (ref 30.0–36.0)
MCV: 71.4 fL — ABNORMAL LOW (ref 80.0–100.0)
Monocytes Absolute: 0.9 10*3/uL (ref 0.1–1.0)
Monocytes Relative: 10 %
Neutro Abs: 7.3 10*3/uL (ref 1.7–7.7)
Neutrophils Relative %: 79 %
Platelets: 487 10*3/uL — ABNORMAL HIGH (ref 150–400)
RBC: 4.09 MIL/uL (ref 3.87–5.11)
RDW: 23.9 % — ABNORMAL HIGH (ref 11.5–15.5)
Smear Review: NORMAL
WBC: 9.2 10*3/uL (ref 4.0–10.5)
nRBC: 0.3 % — ABNORMAL HIGH (ref 0.0–0.2)

## 2020-07-16 LAB — URINALYSIS, COMPLETE (UACMP) WITH MICROSCOPIC
Bilirubin Urine: NEGATIVE
Glucose, UA: NEGATIVE mg/dL
Ketones, ur: NEGATIVE mg/dL
Nitrite: NEGATIVE
Protein, ur: 100 mg/dL — AB
Specific Gravity, Urine: 1.02 (ref 1.005–1.030)
pH: 5 (ref 5.0–8.0)

## 2020-07-16 LAB — LACTATE DEHYDROGENASE: LDH: 200 U/L — ABNORMAL HIGH (ref 98–192)

## 2020-07-16 LAB — RESPIRATORY PANEL BY RT PCR (FLU A&B, COVID)
Influenza A by PCR: NEGATIVE
Influenza B by PCR: NEGATIVE
SARS Coronavirus 2 by RT PCR: NEGATIVE

## 2020-07-16 MED ORDER — FOSFOMYCIN TROMETHAMINE 3 G PO PACK
3.0000 g | PACK | Freq: Once | ORAL | Status: AC
  Administered 2020-07-16: 3 g via ORAL
  Filled 2020-07-16: qty 3

## 2020-07-16 MED ORDER — DEXAMETHASONE SODIUM PHOSPHATE 4 MG/ML IJ SOLN
4.0000 mg | Freq: Once | INTRAMUSCULAR | Status: AC
  Administered 2020-07-16: 4 mg via INTRAVENOUS
  Filled 2020-07-16: qty 1

## 2020-07-16 MED ORDER — OXYCODONE HCL 5 MG/5ML PO SOLN
5.0000 mg | Freq: Four times a day (QID) | ORAL | 0 refills | Status: AC | PRN
Start: 2020-07-16 — End: ?

## 2020-07-16 MED ORDER — DEXAMETHASONE INTENSOL 1 MG/ML PO CONC: 4.0000 mg | Freq: Two times a day (BID) | ORAL | 0 refills | Status: DC

## 2020-07-16 NOTE — Discharge Instructions (Addendum)
We have help to arrange hospice care.  They should be coming out to your residence on Monday to help arrange for their services.  You have been prescribed a pain medication, please use as needed, as prescribed.  Please take your steroids as prescribed as well.  Return to the emergency department for any symptom personally concerning to yourself or family members.

## 2020-07-16 NOTE — ED Notes (Signed)
Pt assisted to BR via w/c; pt able to stand and pivot with stand-by assist; pt with no distress noted; returned to lobby and updated on wait time

## 2020-07-16 NOTE — TOC Progression Note (Signed)
Transition of Care [facility]) - Progression Note    Patient Details  Name: Tracey Jackson MRN: [id number] Date of Birth: 06-06-1935  [facility] CM/SW Contact  Truitt Merle, LCSW Phone Number: 07/16/2020, 10:08 PM  Clinical Narrative:    LCSW received secure chat from [place] inquiring about Home Hospice, as patient/family requested and preparing to discharge home. Spoke with Supervisor Estill Bamberg at [facility] who stated referral can be faxed to [phone]. However, no one will be available to go to home until Monday. Referral faxed, EDP team updated, patient/family agreeable. Daughter to provide transport home for patient.         Expected Discharge Plan and Services                                                 Social Determinants of Health (SDOH) Interventions    Readmission Risk Interventions No flowsheet data found.

## 2020-07-16 NOTE — ED Notes (Signed)
About 100cc of dark amber urine noted in canister from purewick.

## 2020-07-16 NOTE — ED Provider Notes (Signed)
[facility] Emergency Department Provider Note  ____________________________________________   First MD Initiated Contact with Patient 07/16/20 1142     (approximate)  I have reviewed the triage vital signs and the nursing notes.   HISTORY  Chief Complaint Hip Pain   HPI Tracey Jackson is a 85 y.o. female presents to the ED with complaint of right hip pain.  Patient states that she normally uses a walker to get around her home.  She states that she fell approximately 3 weeks ago when she was putting her weight against a table that collapsed.  She states she landed on her left hip but complains of pain in her right.  Patient has continued to ambulate with the use of her walker.  She was seen by her PCP who ordered imaging.  Patient states that she was told by her PCP to come to the emergency department but she is uncertain as to what the reason is.      Past Medical History:  Diagnosis Date  . Anemia   . Coronary artery disease   . Dyspnea    Uses oxygen 2L via [place] daily  . GERD (gastroesophageal reflux disease)   . HLD (hyperlipidemia)   . Hx of blood transfusion reaction    in [place]  . Hypertension   . Lung mass   . Myocardial infarction ([place])    mild per daughter [name] 15  years ago  . Oxygen deficiency    2L/[place] continuous after 06/23/20 admission   . PVD (peripheral vascular disease) ([place])   . Seasonal allergies     Patient Active Problem List   Diagnosis Date Noted  . Right lower lobe lung mass 07/14/2020  . Iron deficiency anemia 07/14/2020  . Right hip pain 07/12/2020  . Right buttock pain 07/12/2020  . Pelvic pain 07/12/2020  . Lung cancer ([place]) 06/25/2020  . Hypoxia 06/24/2020  . Epistaxis 01/02/2020  . Essential hypertension 01/02/2020  . History of MI (myocardial infarction) 01/02/2020  . Acute blood loss anemia 01/02/2020    Past Surgical History:  Procedure Laterality Date  . CARDIAC SURGERY     Stent Placement  . COLONOSCOPY      . FOOT SURGERY Bilateral    x 2 surgeries - some toes amputated on bilateral feet per daughter [name]  . VASCULAR SURGERY     Right axillobifemoral bypass, occluded left limb by 03/11/20 CTA    Prior to Admission medications   Medication Sig Start Date End Date Taking? Authorizing Provider  amLODipine (NORVASC) 5 MG tablet TAKE 1 TABLET(5 MG) BY MOUTH IN THE MORNING AND AT BEDTIME 07/13/20   Burnard Hawthorne, FNP  atorvastatin (LIPITOR) 10 MG tablet Take 1 tablet (10 mg total) by mouth at bedtime. 07/12/20   Marval Regal, NP  clopidogrel (PLAVIX) 75 MG tablet Take 75 mg by mouth daily.  01/28/14   [provider]  dexamethasone (DEXAMETHASONE INTENSOL) 1 MG/ML solution Take 4 mLs (4 mg total) by mouth 2 (two) times daily. 07/16/20   Harvest Dark, MD  ferrous sulfate 325 (65 FE) MG tablet Take 1 tablet (325 mg total) by mouth daily with breakfast. 07/12/20   Marval Regal, NP  folic acid (FOLVITE) 1 MG tablet Take 1 tablet (1 mg total) by mouth daily. 07/12/20   Marval Regal, NP  furosemide (LASIX) 20 MG tablet Take 1 tablet (20 mg total) by mouth daily. 07/12/20   Marval Regal, NP  loratadine-pseudoephedrine (CLARITIN-D 24  HOUR) 10-240 MG 24 hr tablet Take 1 tablet by mouth daily as needed for allergies. 07/12/20   Marval Regal, NP  oxyCODONE (ROXICODONE) 5 MG/5ML solution Take 5 mLs (5 mg total) by mouth every 6 (six) hours as needed for moderate pain. 07/16/20   Harvest Dark, MD  pantoprazole (PROTONIX) 40 MG tablet Take 1 tablet (40 mg total) by mouth daily. 07/12/20   Marval Regal, NP    Allergies Patient has no known allergies.  Family History  Problem Relation Age of Onset  . Cancer Daughter   . Cancer Daughter   . Heart disease Daughter     Social History Social History   Tobacco Use  . Smoking status: Former Smoker    Packs/day: 0.25    Years: 60.00    Pack years: 15.00    Types: Cigarettes    Quit date: 06/23/2020    Years since  quitting: 0.0  . Smokeless tobacco: Never Used  Vaping Use  . Vaping Use: Never used  Substance Use Topics  . Alcohol use: Never  . Drug use: No    Review of Systems Constitutional: No fever/chills Eyes: No visual changes. ENT: No sore throat. Cardiovascular: Denies chest pain. Respiratory: Denies shortness of breath.  Positive history of asthma per patient. Gastrointestinal: No abdominal pain.  No nausea, no vomiting.  Genitourinary: Negative for dysuria. Musculoskeletal: Positive for right sided hip pain.  Negative for low back pain. Skin: Negative for rash. Neurological: Negative for headaches, focal weakness or numbness.  ____________________________________________   PHYSICAL EXAM:  VITAL SIGNS: ED Triage Vitals  Enc Vitals Group     BP 07/15/20 1955 118/78     Pulse Rate 07/15/20 1955 (!) 118     Resp 07/15/20 1955 20     Temp 07/15/20 1955 98.6 F (37 C)     Temp Source 07/15/20 1955 Oral     SpO2 07/15/20 1955 94 %     Weight 07/15/20 1956 122 lb (55.3 kg)     Height 07/15/20 1956 '5\' 1"'  (1.549 m)     Head Circumference --      Peak Flow --      Pain Score 07/15/20 1956 10     Pain Loc --      Pain Edu? --      Excl. in [place]? --     Constitutional: Alert and oriented. Well appearing and in no acute distress. Eyes: Conjunctivae are normal.  Head: Atraumatic. Neck: No stridor.   Cardiovascular: Normal rate, regular rhythm. Grossly normal heart sounds.  Good peripheral circulation. Respiratory: Normal respiratory effort.  No retractions. Lungs faint expiratory wheeze heard throughout. Gastrointestinal: Soft and nontender. No distention.  Musculoskeletal: On examination of the right hip there is no gross deformity and no abrasions or skin discoloration noted.  There is tenderness on palpation of the lateral aspect of the right hip.  Patient is able to flex and extend without any assistance.  There is no shortening or rotation noted.  Lower extremities without  soft tissue edema present.  Pulses present bilaterally. Neurologic:  Normal speech and language. No gross focal neurologic deficits are appreciated. No gait instability. Skin:  Skin is warm, dry and intact.  No abrasions, ecchymosis or erythema noted. Psychiatric: Mood and affect are normal. Speech and behavior are normal.  ____________________________________________   LABS (all labs ordered are listed, but only abnormal results are displayed)  Labs Reviewed  COMPREHENSIVE METABOLIC PANEL - Abnormal; Notable for the following components:  Result Value   Potassium 3.3 (*)    Glucose, Bld 124 (*)    Albumin 2.5 (*)    GFR calc non Af Amer 52 (*)    All other components within normal limits  CBC WITH DIFFERENTIAL/PLATELET - Abnormal; Notable for the following components:   Hemoglobin 8.1 (*)    HCT 29.2 (*)    MCV 71.4 (*)    MCH 19.8 (*)    MCHC 27.7 (*)    RDW 23.9 (*)    Platelets 487 (*)    nRBC 0.3 (*)    All other components within normal limits  LACTATE DEHYDROGENASE - Abnormal; Notable for the following components:   LDH 200 (*)    All other components within normal limits  URINALYSIS, COMPLETE (UACMP) WITH MICROSCOPIC - Abnormal; Notable for the following components:   Color, Urine AMBER (*)    APPearance CLOUDY (*)    Hgb urine dipstick SMALL (*)    Protein, ur 100 (*)    Leukocytes,Ua MODERATE (*)    Bacteria, UA MANY (*)    All other components within normal limits  RESPIRATORY PANEL BY RT PCR (FLU A&B, COVID)  MULTIPLE MYELOMA PANEL, SERUM  KAPPA/LAMBDA LIGHT CHAINS    RADIOLOGY   Official radiology report(s): DG Chest Port 1 View  Result Date: 07/16/2020 CLINICAL DATA:  History of lung cancer, wheezing. EXAM: PORTABLE CHEST 1 VIEW COMPARISON:  June 23, 2020. FINDINGS: Stable cardiomegaly with mild central pulmonary vascular congestion. No pneumothorax is noted. Stable right basilar opacity is noted consistent with lung mass. Bony thorax is  unremarkable. IMPRESSION: Stable cardiomegaly with mild central pulmonary vascular congestion. Stable right basilar opacity consistent with lung mass. Electronically Signed   By: Marijo Conception M.D.   On: 07/16/2020 14:53    ____________________________________________   PROCEDURES  Procedure(s) performed (including Critical Care):  Procedures   ____________________________________________   INITIAL IMPRESSION / ASSESSMENT AND PLAN / ED COURSE  As part of my medical decision making, I reviewed the following data within the electronic MEDICAL RECORD NUMBER Notes from prior ED visits and [place] Controlled Substance Database  85 year old female presents to the ED with complaint of right hip pain.  Patient has a history of falling 3 weeks ago with injury to her left hip.  She developed pain in her right hip but has continued to ambulate with the assistance of her walker.  Patient denies any previous right hip injury.  This was a mechanical fall 3 weeks ago.  She was seen by her PCP who ordered a CT scan which revealed a soft tissue mass measuring 5.3 cm to the right of the sacral area and in to the right iliac bone.  Also there were multiple lytic lesions noted concerning for metastatic disease.  Oncology was consulted and will be seeing the patient in the emergency department.   Labs and a Covid swab was ordered.   ____________________________________________   FINAL CLINICAL IMPRESSION(S) / ED DIAGNOSES  Final diagnoses:  Mass of sacrum     ED Discharge Orders         Ordered    dexamethasone (DEXAMETHASONE INTENSOL) 1 MG/ML solution  2 times daily        07/16/20 2044    oxyCODONE (ROXICODONE) 5 MG/5ML solution  Every 6 hours PRN        07/16/20 2044          *Please note:  Geraldyn Shain was evaluated in Emergency Department on 07/17/2020 for the  symptoms described in the history of present illness. She was evaluated in the context of the global COVID-19 pandemic, which necessitated  consideration that the patient might be at risk for infection with the SARS-CoV-2 virus that causes COVID-19. Institutional protocols and algorithms that pertain to the evaluation of patients at risk for COVID-19 are in a state of rapid change based on information released by regulatory bodies including the CDC and federal and state organizations. These policies and algorithms were followed during the patient's care in the ED.  Some ED evaluations and interventions may be delayed as a result of limited staffing during and the pandemic.*   Note:  This document was prepared using Dragon voice recognition software and may include unintentional dictation errors.    Johnn Hai, PA-C 07/17/20 1010    Merlyn Lot, MD 07/26/20 1520

## 2020-07-16 NOTE — ED Provider Notes (Addendum)
-----------------------------------------   8:35 PM on 07/16/2020 -----------------------------------------  Patient care assumed from physician assistant Letitia Neri.  I have personally seen evaluate the patient she appears quite well, she is currently eating dinner.  I have been speaking to the social work team as well as [employer]ch[employer], [employer] via messaging.  They have referred the patient for hospice care and they expect someone to come out to the house on Monday.  I spoke to the patient regarding going home and she is agreeable to this plan of care, we will discharge with steroids and pain medication as per Dr. Andre Lefort recommendations.  Patient states she has trouble swallowing large pills, states she can swallow small pills.  Would prefer liquid pain medication if possible.  We will prescribe liquid medications for the patient and home hospice will follow up on Monday.  I have attempted to contact the daughter regarding this plan but have been unsuccessful so far we will continue trying.   Harvest Dark, MD 07/16/20 2037  I was able to speak to the patient's daughter, she is agreeable to this plan of care will be coming to pick her up shortly.    Harvest Dark, MD 07/16/20 2041

## 2020-07-16 NOTE — Telephone Encounter (Signed)
On 9/30-spoke to PCP Ms. Lennox Grumbles regarding patient's intractable pain-lung mass/pelvic mass.  Highly suspicious for malignancy; biopsy not done because of recent Plavix/awaiting washout.   Offered to see the patient in the clinic on 10/01; however as per PCP patient in significant pain-prefers to go to the emergency room.

## 2020-07-16 NOTE — Telephone Encounter (Signed)
Renee called back yesterday afternoon and wanted to take patient to ER like advised by Maudie Mercury. Kim spoke with [name] about all of this when she returned call.

## 2020-07-16 NOTE — ED Notes (Signed)
Pt's daughter [name] notified pt to be d/c and brought out to front of ER in about 5mins.

## 2020-07-16 NOTE — ED Notes (Signed)
Called for dinner tray

## 2020-07-16 NOTE — Consult Note (Signed)
[facility] CONSULT NOTE  Patient Care Team: Marval Regal, NP as PCP - General (Nurse Practitioner) Pcp, No  CHIEF COMPLAINTS/PURPOSE OF CONSULTATION: Metastatic cancer  HISTORY OF PRESENTING ILLNESS:  Tracey Jackson 85 y.o.  female onset history of smoking; severe COPD on home O2; CAD/cardiomyopathy ejection fraction 40%-is currently in the hospital for right-sided hip pain.  Patient recently moved from [place] to live with her daughter in [place].  Patient was recently admitted to hospital noted to have right lower lobe mass concerning for malignancy.  Inpatient biopsy could not be done because of ongoing Plavix.  However patient was evaluated by pulmonary; awaiting cardiac clearance for bronchoscopy/biopsy.  Patient however noted to have worsening right hip pain progressive getting worse.   Patient was subsequently evaluated by PCP-a pelvic CT scan showed a destructive mass in the right pelvis; again concerning for malignancy.  Patient currently in the emergency room for pain control.  Patient while resting does not complain of any pain.  However movement/even a car ride is excruciating painful as per the daughter.   Patient complains of worsening fatigue.  Complains of progressive weight loss.  Currently denies any active shortness of breath or cough.  Complains of back pain right hip pain.  Review of Systems  Constitutional: Positive for malaise/fatigue and weight loss. Negative for chills, diaphoresis and fever.  HENT: Negative for nosebleeds and sore throat.   Eyes: Negative for double vision.  Respiratory: Negative for cough, hemoptysis, sputum production, shortness of breath and wheezing.   Cardiovascular: Negative for chest pain, palpitations, orthopnea and leg swelling.  Gastrointestinal: Negative for abdominal pain, blood in stool, constipation, diarrhea, heartburn, melena, nausea and vomiting.  Genitourinary: Negative for dysuria, frequency and urgency.   Musculoskeletal: Positive for back pain, joint pain and neck pain.  Skin: Negative.  Negative for itching and rash.  Neurological: Negative for dizziness, tingling, focal weakness, weakness and headaches.  Endo/Heme/Allergies: Does not bruise/bleed easily.  Psychiatric/Behavioral: Negative for depression. The patient is not nervous/anxious and does not have insomnia.      MEDICAL HISTORY:  Past Medical History:  Diagnosis Date  . Anemia   . Coronary artery disease   . Dyspnea    Uses oxygen 2L via [place] daily  . GERD (gastroesophageal reflux disease)   . HLD (hyperlipidemia)   . Hx of blood transfusion reaction    in [place]  . Hypertension   . Lung mass   . Myocardial infarction ([place])    mild per daughter [name] 15  years ago  . Oxygen deficiency    2L/[place] continuous after 06/23/20 admission   . PVD (peripheral vascular disease) ([facility])   . Seasonal allergies     SURGICAL HISTORY: Past Surgical History:  Procedure Laterality Date  . CARDIAC SURGERY     Stent Placement  . COLONOSCOPY    . FOOT SURGERY Bilateral    x 2 surgeries - some toes amputated on bilateral feet per daughter [name]  . VASCULAR SURGERY     Right axillobifemoral bypass, occluded left limb by 03/11/20 CTA    SOCIAL HISTORY: Social History   Socioeconomic History  . Marital status: Widowed    Spouse name: Not on file  . Number of children: Not on file  . Years of education: Not on file  . Highest education level: Not on file  Occupational History  . Not on file  Tobacco Use  . Smoking status: Former Smoker    Packs/day: 0.25  Years: 60.00    Pack years: 15.00    Types: Cigarettes    Quit date: 06/23/2020    Years since quitting: 0.0  . Smokeless tobacco: Never Used  Vaping Use  . Vaping Use: Never used  Substance and Sexual Activity  . Alcohol use: Never  . Drug use: No  . Sexual activity: Not Currently    Birth control/protection: Post-menopausal  Other Topics Concern  . Not on file  Social  History Narrative   Living here now with Renee and gets up a little to get to portable pot, DTR does full care with cook, clean, showers her. The pt is ael to brush her teeth. Needs help at home.        She became dependent 2 mos ago.         Social Determinants of Health   Financial Resource Strain:   . Difficulty of Paying Living Expenses: Not on file  Food Insecurity:   . Worried About Charity fundraiser in the Last Year: Not on file  . Ran Out of Food in the Last Year: Not on file  Transportation Needs:   . Lack of Transportation (Medical): Not on file  . Lack of Transportation (Non-Medical): Not on file  Physical Activity:   . Days of Exercise per Week: Not on file  . Minutes of Exercise per Session: Not on file  Stress:   . Feeling of Stress : Not on file  Social Connections:   . Frequency of Communication with Friends and Family: Not on file  . Frequency of Social Gatherings with Friends and Family: Not on file  . Attends Religious Services: Not on file  . Active Member of Clubs or Organizations: Not on file  . Attends Archivist Meetings: Not on file  . Marital Status: Not on file  Intimate Partner Violence:   . Fear of Current or Ex-Partner: Not on file  . Emotionally Abused: Not on file  . Physically Abused: Not on file  . Sexually Abused: Not on file    FAMILY HISTORY: Family History  Problem Relation Age of Onset  . Cancer Daughter   . Cancer Daughter   . Heart disease Daughter     ALLERGIES:  has No Known Allergies.  MEDICATIONS:  Current Facility-Administered Medications  Medication Dose Route Frequency Provider Last Rate Last Admin  . dexamethasone (DECADRON) injection 4 mg  4 mg Intravenous Once Cammie Sickle, MD       Current Outpatient Medications  Medication Sig Dispense Refill  . amLODipine (NORVASC) 5 MG tablet TAKE 1 TABLET(5 MG) BY MOUTH IN THE MORNING AND AT BEDTIME 180 tablet 0  . atorvastatin (LIPITOR) 10 MG tablet Take  1 tablet (10 mg total) by mouth at bedtime. 90 tablet 0  . clopidogrel (PLAVIX) 75 MG tablet Take 75 mg by mouth daily.     . ferrous sulfate 325 (65 FE) MG tablet Take 1 tablet (325 mg total) by mouth daily with breakfast. 90 tablet 0  . folic acid (FOLVITE) 1 MG tablet Take 1 tablet (1 mg total) by mouth daily. 90 tablet 0  . furosemide (LASIX) 20 MG tablet Take 1 tablet (20 mg total) by mouth daily. 30 tablet 1  . loratadine-pseudoephedrine (CLARITIN-D 24 HOUR) 10-240 MG 24 hr tablet Take 1 tablet by mouth daily as needed for allergies. 90 tablet 0  . pantoprazole (PROTONIX) 40 MG tablet Take 1 tablet (40 mg total) by mouth daily. 90 tablet 0      .  PHYSICAL EXAMINATION:  Vitals:   07/16/20 1745 07/16/20 1745  BP:    Pulse:    Resp:    Temp:    SpO2: 92% 93%   Filed Weights   07/15/20 1956  Weight: 122 lb (55.3 kg)    Physical Exam Vitals and nursing note reviewed.  Constitutional:      Comments: Cachectic appearing African-American female patient.  She is accompanied by her daughter.  HENT:     Head: Normocephalic and atraumatic.     Mouth/Throat:     Pharynx: No oropharyngeal exudate.  Eyes:     Pupils: Pupils are equal, round, and reactive to light.  Cardiovascular:     Rate and Rhythm: Normal rate and regular rhythm.  Pulmonary:     Effort: No respiratory distress.     Breath sounds: No wheezing.     Comments: Decreased air entry bilaterally. Abdominal:     General: Bowel sounds are normal. There is no distension.     Palpations: Abdomen is soft. There is no mass.     Tenderness: There is no abdominal tenderness. There is no guarding or rebound.  Musculoskeletal:        General: No tenderness. Normal range of motion.     Cervical back: Normal range of motion and neck supple.  Skin:    General: Skin is warm.  Neurological:     Mental Status: She is alert and oriented to person, place, and time.  Psychiatric:        Mood and Affect: Affect normal.       LABORATORY DATA:  I have reviewed the data as listed Lab Results  Component Value Date   WBC 9.2 07/16/2020   HGB 8.1 (L) 07/16/2020   HCT 29.2 (L) 07/16/2020   MCV 71.4 (L) 07/16/2020   PLT 487 (H) 07/16/2020   Recent Labs    06/23/20 2355 06/24/20 1844 06/25/20 0408 06/26/20 0402 07/16/20 1316  NA   < >  --  142 140 138  K   < >  --  3.5 3.9 3.3*  CL   < >  --  108 105 103  CO2   < >  --  '24 26 28  ' GLUCOSE   < >  --  106* 107* 124*  BUN   < >  --  '11 14 21  ' CREATININE   < > 0.98 1.03* 1.17* 1.00  CALCIUM   < >  --  8.4* 8.4* 9.0  GFRNONAA   < > 53* 50* 43* 52*  GFRAA   < > >60 58* 50* >60  PROT  --   --   --   --  8.1  ALBUMIN  --  2.3*  --   --  2.5*  AST  --   --   --   --  16  ALT  --   --   --   --  12  ALKPHOS  --   --   --   --  102  BILITOT  --   --   --   --  0.6   < > = values in this interval not displayed.    RADIOGRAPHIC STUDIES: I have personally reviewed the radiological images as listed and agreed with the findings in the report. DG Chest 2 View  Result Date: 06/24/2020 CLINICAL DATA:  Shortness of breath EXAM: CHEST - 2 VIEW COMPARISON:  03/11/2020 FINDINGS: Cardiomegaly. Consolidation in the right lower lobe compatible with  pneumonia. Mild vascular congestion. No confluent opacity on the left. No effusions or acute bony abnormality. IMPRESSION: Cardiomegaly, vascular congestion. Right lower lobe consolidation compatible with pneumonia. Electronically Signed   By: Rolm Baptise M.D.   On: 06/24/2020 00:10   CT Angio Chest PE W and/or Wo Contrast  Result Date: 06/24/2020 CLINICAL DATA:  Shortness of breath EXAM: CT ANGIOGRAPHY CHEST WITH CONTRAST TECHNIQUE: Multidetector CT imaging of the chest was performed using the standard protocol during bolus administration of intravenous contrast. Multiplanar CT image reconstructions and MIPs were obtained to evaluate the vascular anatomy. CONTRAST:  78m OMNIPAQUE IOHEXOL 350 MG/ML SOLN COMPARISON:   03/11/2020 FINDINGS: Cardiovascular: Heart is mildly enlarged. Thoracic aorta demonstrates atherosclerotic calcifications without aneurysmal dilatation. Pulmonary artery shows a normal branching pattern. No filling defect to suggest pulmonary embolus is noted although some attenuation of the lower lobe branches on the right is noted secondary to the underlying mass lesion and hilar adenopathy. Axillary bypass graft is noted on the right although it is not well opacified due to timing of the contrast bolus. Mediastinum/Nodes: Thoracic inlet is within normal limits. Scattered calcified hilar and mediastinal nodes are noted consistent with prior granulomatous disease. Additionally there is central right hilar adenopathy measuring 2.3 cm in greatest dimension. Infrahilar mass density is noted which may represent a combination of neoplasm and hilar adenopathy. Lungs/Pleura: The known right lower lobe mass lesion is again identified and measures 3.7 cm. This is relatively stable from the prior exam although new soft tissue density extending from the mass centrally towards the hila is noted consistent with progression of knee plastic mass. Additionally scattered nodules are seen within the right upper lobe as well as consolidation in the middle lobe. Bronchial plugging is noted in the right lower lobe likely related to tumor ingrowth which is new from the prior exam. Some mass effect upon the right atrium is noted best seen on images 256 through 269 of series 7. This may simply represent consolidated lung although the possibility of ingrowth into the right atrium deserves consideration. Correlation with echocardiography may be helpful. Upper Abdomen: Stable cystic changes in the kidneys are noted bilaterally but incompletely evaluated due to timing of contrast bolus. Cholelithiasis is noted. Remainder of the upper abdomen appears within normal limits. Musculoskeletal: Degenerative changes of the thoracic spine are noted.  Review of the MIP images confirms the above findings. IMPRESSION: No evidence of pulmonary emboli. Significant progression of the known right lower lobe mass with extension of soft tissue neoplasm to the level of the right hilum with significant adenopathy and neoplasm involving the region of the right hilum. This causes attenuation of the pulmonary arterial branches in the right lower lobe although no emboli are seen. Tumor ingrowth into the lower lobe bronchi is noted as well. Changes suspicious for in growth of tumor into the lateral wall of the right atrium although difficult to evaluate on this exam. Echocardiography may be helpful as clinically indicated. Nodular changes in the right upper lobe are noted suspicious for neoplastic involvement. Additionally collapse of the right middle lobe is noted likely related to central bronchial occlusion. Stable changes in the kidneys consistent with cystic change. Aortic Atherosclerosis (ICD10-I70.0). Electronically Signed   By: MInez CatalinaM.D.   On: 06/24/2020 16:06   CT PELVIS WO CONTRAST  Result Date: 07/15/2020 CLINICAL DATA:  Fall 1 month ago with pelvic pain, initial encounter EXAM: CT PELVIS WITHOUT CONTRAST TECHNIQUE: Multidetector CT imaging of the pelvis was performed following the standard  protocol without intravenous contrast. COMPARISON:  03/11/2020 FINDINGS: Urinary Tract: Bilateral renal cystic changes are noted. Several of these are hyperdense and show calcification. Bladder is decompressed. Bowel: No obstructive or inflammatory changes of the bowel are noted. Vascular/Lymphatic: Atherosclerotic calcifications are seen. Right iliac stenting is noted. Additionally, axillo to bifemoral bypass graft is seen similar to that noted on the prior exam. Dilatation of the abdominal aorta is noted stable from the prior study. Reproductive: Multiple calcified uterine fibroids are noted. No adnexal mass is noted. Other:  None. Musculoskeletal: There is a soft  tissue mass centered in the right half of the sacrum with underlying destruction of the sacrum as well as the medial aspect of the iliac bone. Multiple small lytic lesions are noted throughout the bony structures. These changes are suspicious for underlying myeloma and are new from the prior exam. IMPRESSION: No acute fracture is identified. There is however a large soft tissue mass within the right half of the sacrum and extending into the right iliac bone which measures approximately 5.3 cm in greatest dimension. Although this likely represents myeloma possibility of metastatic disease deserves consideration as well. Biopsy is recommended for further evaluation. These changes are new from the prior CT examination from 03/11/2020 Chronic changes as described These results will be called to the ordering clinician or representative by the Radiologist Assistant, and communication documented in the PACS or [employer]. Electronically Signed   By: Inez Catalina M.D.   On: 07/15/2020 14:54   DG Chest Port 1 View  Result Date: 07/16/2020 CLINICAL DATA:  History of lung cancer, wheezing. EXAM: PORTABLE CHEST 1 VIEW COMPARISON:  June 23, 2020. FINDINGS: Stable cardiomegaly with mild central pulmonary vascular congestion. No pneumothorax is noted. Stable right basilar opacity is noted consistent with lung mass. Bony thorax is unremarkable. IMPRESSION: Stable cardiomegaly with mild central pulmonary vascular congestion. Stable right basilar opacity consistent with lung mass. Electronically Signed   By: Marijo Conception M.D.   On: 07/16/2020 14:53   DG Hip Unilat W or Wo Pelvis 2-3 Views Right  Result Date: 07/15/2020 CLINICAL DATA:  Status post fall 3 weeks ago with subsequent right hip pain. EXAM: DG HIP (WITH OR WITHOUT PELVIS) 2-3V RIGHT COMPARISON:  July 13, 2020 FINDINGS: There is no evidence of hip fracture or dislocation. Mild to moderate severity degenerative changes seen involving the right hip.  This is in the form of joint space narrowing and acetabular sclerosis. Mild degenerative changes are noted within the left hip. The osseous structures of the mid to lower sacrum and coccyx are poorly defined. Radiopaque vascular stents are again seen within the right hemipelvis and proximal right upper extremity. IMPRESSION: 1. Degenerative changes involving the bilateral hips. 2. No acute osseous abnormality. Electronically Signed   By: Virgina Norfolk M.D.   On: 07/15/2020 20:41   DG HIP UNILAT WITH PELVIS 2-3 VIEWS RIGHT  Result Date: 07/14/2020 CLINICAL DATA:  Right hip pain after fall 1 month ago EXAM: DG HIP (WITH OR WITHOUT PELVIS) 2-3V RIGHT COMPARISON:  03/11/2020 FINDINGS: There is no evidence of hip fracture or dislocation. Degenerative changes of the bilateral hips, right worse than left. Sacrum is largely obscured by overlying bowel gas. Multiple right iliac and femoral vascular stents. IMPRESSION: Degenerative changes of the bilateral hips, right worse than left. No acute fracture or dislocation. Electronically Signed   By: Davina Poke D.O.   On: 07/14/2020 15:41    Right lower lobe lung mass #85 year old female patient with  multiple medical problems including history of smoking; CAD; recent diagnosis of right lung mass/sacral mass bone mets to the ER for worsening pain  #Right lower lobe lung mass-highly suggestive of malignancy with metastasis to bone/sacral metastasis   #CAD/cardiomyopathy ejection fraction 40%  #Smoking history-quit approximately a month ago.  Recommendations:  #I met with the patient and her daughter and had long conversation regarding clinical concerns for lung cancer with metastases to sacrum/bone lesions especially in the context of smoking.  Discussed in general protocol to confirm malignancy would include biopsy-which would help [place] confirm malignancy/and also primary disease.  In terms of treatment-could be offered based upon results of the biopsy.   However patient's multiple comorbidities/cachexia will put her at prohibitively high risk of complications from chemotherapy.  Discussed regarding radiation for palliation of pain.  However family is concerned about potential side effects/minimal benefits of any further aggressive therapy.  They are interested in keeping her comfortable/quality of life.  Interested in hospice at patient's home.  I discussed the hospice protocol/philosophy at length with the daughter.  The daughter, [name] states that she is willing to keep her mother during this ordeal.  Also spoke to patient's son' over the phone who is in agreement with hospice.   #For pain control I would recommend-dexamethasone IV 4 mg x 1.  Also at discharge recommend dexamethasone 4 mg twice daily; and hydrocodone 1 pill every 6-8 hours for pain control.   #Disposition: Discussed with the patient and daughter that we will try to discharge her home from the emergency room with adequate pain control.  Social worker contacted regarding home hospice admission.  Patient/daughter understand that hospice physically might not be present over the weekend.   Thank you Dr. Kerman Passey  for allowing me to participate in the care of your pleasant patient. Please do not hesitate to contact me with questions or concerns in the interim.  Discussed with Dr. [place] Passey.     All questions were answered. The patient knows to call the clinic with any problems, questions or concerns.  # I reviewed the blood work- with the patient in detail; also reviewed the imaging independently [as summarized above]; and with the patient in detail.      Cammie Sickle, MD 07/16/2020 6:35 PM

## 2020-07-16 NOTE — Assessment & Plan Note (Addendum)
#  85 year old female patient with multiple medical problems including history of smoking; CAD; recent diagnosis of right lung mass/sacral mass bone mets to the ER for worsening pain  #Right lower lobe lung mass-highly suggestive of malignancy with metastasis to bone/sacral metastasis   #CAD/cardiomyopathy ejection fraction 40%  #Smoking history-quit approximately a month ago.  Recommendations:  #I met with the patient and her daughter and had long conversation regarding clinical concerns for lung cancer with metastases to sacrum/bone lesions especially in the context of smoking.  Discussed in general protocol to confirm malignancy would include biopsy-which would help [place] confirm malignancy/and also primary disease.  In terms of treatment-could be offered based upon results of the biopsy.  However patient's multiple comorbidities/cachexia will put her at prohibitively high risk of complications from chemotherapy.  Discussed regarding radiation for palliation of pain.  However family is concerned about potential side effects/minimal benefits of any further aggressive therapy.  They are interested in keeping her comfortable/quality of life.  Interested in hospice at patient's home.  I discussed the hospice protocol/philosophy at length with the daughter.  The daughter, [name] states that she is willing to keep her mother during this ordeal.  Also spoke to patient's son' over the phone who is in agreement with hospice.   #For pain control I would recommend-dexamethasone IV 4 mg x 1.  Also at discharge recommend dexamethasone 4 mg twice daily; and hydrocodone 1 pill every 6-8 hours for pain control.   #Disposition: Discussed with the patient and daughter that we will try to discharge her home from the emergency room with adequate pain control.  Social worker contacted regarding home hospice admission.  Patient/daughter understand that hospice physically might not be present over the weekend.   Thank you Dr.  Kerman Passey  for allowing me to participate in the care of your pleasant patient. Please do not hesitate to contact me with questions or concerns in the interim.  Discussed with Dr. [place] Passey.

## 2020-07-19 ENCOUNTER — Telehealth: Payer: Self-pay | Admitting: *Deleted

## 2020-07-19 ENCOUNTER — Other Ambulatory Visit: Payer: Self-pay | Admitting: Hospice and Palliative Medicine

## 2020-07-19 LAB — MULTIPLE MYELOMA PANEL, SERUM
Albumin SerPl Elph-Mcnc: 2.4 g/dL — ABNORMAL LOW (ref 2.9–4.4)
Albumin/Glob SerPl: 0.5 — ABNORMAL LOW (ref 0.7–1.7)
Alpha 1: 0.5 g/dL — ABNORMAL HIGH (ref 0.0–0.4)
Alpha2 Glob SerPl Elph-Mcnc: 1.1 g/dL — ABNORMAL HIGH (ref 0.4–1.0)
B-Globulin SerPl Elph-Mcnc: 1.3 g/dL (ref 0.7–1.3)
Gamma Glob SerPl Elph-Mcnc: 2.6 g/dL — ABNORMAL HIGH (ref 0.4–1.8)
Globulin, Total: 5.4 g/dL — ABNORMAL HIGH (ref 2.2–3.9)
IgA: 678 mg/dL — ABNORMAL HIGH (ref 64–422)
IgG (Immunoglobin G), Serum: 2507 mg/dL — ABNORMAL HIGH (ref 586–1602)
IgM (Immunoglobulin M), Srm: 233 mg/dL — ABNORMAL HIGH (ref 26–217)
Total Protein ELP: 7.8 g/dL (ref 6.0–8.5)

## 2020-07-19 LAB — KAPPA/LAMBDA LIGHT CHAINS
Kappa free light chain: 174.8 mg/L — ABNORMAL HIGH (ref 3.3–19.4)
Kappa, lambda light chain ratio: 1.94 — ABNORMAL HIGH (ref 0.26–1.65)
Lambda free light chains: 90 mg/L — ABNORMAL HIGH (ref 5.7–26.3)

## 2020-07-19 MED ORDER — ONDANSETRON HCL 4 MG PO TABS: 4.0000 mg | ORAL_TABLET | Freq: Three times a day (TID) | ORAL | 0 refills | Status: AC | PRN

## 2020-07-19 MED ORDER — PROCHLORPERAZINE MALEATE 10 MG PO TABS: 10.0000 mg | ORAL_TABLET | Freq: Four times a day (QID) | ORAL | 0 refills | Status: AC | PRN

## 2020-07-19 MED ORDER — DEXAMETHASONE 4 MG PO TABS: 4.0000 mg | ORAL_TABLET | Freq: Two times a day (BID) | ORAL | 0 refills | Status: AC

## 2020-07-19 NOTE — Progress Notes (Deleted)
Cardiology Office Note:    Date:  07/19/2020   ID:  Tracey Jackson, DOB 12-Mar-1935, MRN [id number]  PCP:  Marval Regal, NP  [facility] Cardiologist:  No primary care provider on file.  [facility] Electrophysiologist:  None   Referring MD: Collene Gobble, MD    History of Present Illness:    Tracey Jackson is a 85 y.o. female with a hx of severe COPD on home O2, CAD with ischemic cardiomyopathy with LVEF 40%, PAD,HLD, and newly diagnosed right lower lobe lung mass and destructive mass in the right pelvis who was referred to clinic by Dr. Brock Ra for cardiac clearance prior to bronch with biopsy.  Labs: HgB 8.1, L 3.3, Na 138, BUN 21, Cr 1, LDH 200 Past Medical History:  Diagnosis Date  . Anemia   . Coronary artery disease   . Dyspnea    Uses oxygen 2L via [place] daily  . GERD (gastroesophageal reflux disease)   . HLD (hyperlipidemia)   . Hx of blood transfusion reaction    in [place]  . Hypertension   . Lung mass   . Myocardial infarction ([place])    mild per daughter [name] 15  years ago  . Oxygen deficiency    2L/[place] continuous after 06/23/20 admission   . PVD (peripheral vascular disease) ([place])   . Seasonal allergies     Past Surgical History:  Procedure Laterality Date  . CARDIAC SURGERY     Stent Placement  . COLONOSCOPY    . FOOT SURGERY Bilateral    x 2 surgeries - some toes amputated on bilateral feet per daughter [name]  . VASCULAR SURGERY     Right axillobifemoral bypass, occluded left limb by 03/11/20 CTA    Current Medications: No outpatient medications have been marked as taking for the 07/21/20 encounter (Appointment) with Freada Bergeron, MD.     Allergies:   Patient has no known allergies.   Social History   Socioeconomic History  . Marital status: Widowed    Spouse name: Not on file  . Number of children: Not on file  . Years of education: Not on file  . Highest education level: Not on file  Occupational History  . Not on file  Tobacco Use  .  Smoking status: Former Smoker    Packs/day: 0.25    Years: 60.00    Pack years: 15.00    Types: Cigarettes    Quit date: 06/23/2020    Years since quitting: 0.0  . Smokeless tobacco: Never Used  Vaping Use  . Vaping Use: Never used  Substance and Sexual Activity  . Alcohol use: Never  . Drug use: No  . Sexual activity: Not Currently    Birth control/protection: Post-menopausal  Other Topics Concern  . Not on file  Social History Narrative   Living here now with Renee and gets up a little to get to portable pot, DTR does full care with cook, clean, showers her. The pt is ael to brush her teeth. Needs help at home.        She became dependent 2 mos ago.         Social Determinants of Health   Financial Resource Strain:   . Difficulty of Paying Living Expenses: Not on file  Food Insecurity:   . Worried About Charity fundraiser in the Last Year: Not on file  . Ran Out of Food in the Last Year: Not on file  Transportation Needs:   .  Lack of Transportation (Medical): Not on file  . Lack of Transportation (Non-Medical): Not on file  Physical Activity:   . Days of Exercise per Week: Not on file  . Minutes of Exercise per Session: Not on file  Stress:   . Feeling of Stress : Not on file  Social Connections:   . Frequency of Communication with Friends and Family: Not on file  . Frequency of Social Gatherings with Friends and Family: Not on file  . Attends Religious Services: Not on file  . Active Member of Clubs or Organizations: Not on file  . Attends Archivist Meetings: Not on file  . Marital Status: Not on file     Family History: The patient's ***family history includes Cancer in her daughter and daughter; Heart disease in her daughter.  ROS:   Please see the history of present illness.    *** All other systems reviewed and are negative.  EKGs/Labs/Other Studies Reviewed:    The following studies were reviewed today: LE doppler ultrasound: IMPRESSION: No  femoropopliteal DVT nor evidence of DVT within the visualized calf veins. If clinical symptoms are inconsistent or if there arepersistent or worsening symptoms, further imaging (possiblyinvolving the iliac veins) may be warranted.  Incidental note made of a left lower extremity vascular graft, possibly femoral bypass graft which appears completely occluded without demonstrable color flow. Correlate with patient's surgical history.  CTA Chest: IMPRESSION: No evidence of pulmonary emboli.  Significant progression of the known right lower lobe mass with extension of soft tissue neoplasm to the level of the right hilum with significant adenopathy and neoplasm involving the region of the right hilum. This causes attenuation of the pulmonary arterial branches in the right lower lobe although no emboli are seen. Tumor ingrowth into the lower lobe bronchi is noted as well.  Changes suspicious for in growth of tumor into the lateral wall of the right atrium although difficult to evaluate on this exam. Echocardiography may be helpful as clinically indicated.  Nodular changes in the right upper lobe are noted suspicious for neoplastic involvement. Additionally collapse of the right middle lobe is noted likely related to central bronchial occlusion.  Stable changes in the kidneys consistent with cystic change.  CT Abdomen Pelvis: IMPRESSION: Vascular findings:  1. No acute finding. 2. Right axillobifemoral graft with occluded left limb. Left-sided hypogastric and lower extremity arteries reconstitute from numerous well-formed collaterals. 3. Right iliac occlusion with reconstitution from the axillofemoral bypass. 4. Abdominal aortic aneurysm beginning at the renal arteries and measuring up to 4.6 cm in diameter. No available comparisons, Recommend followup by abdomen and pelvis CTA in 6 months, and vascular surgery referral/consultation if not already obtained. This recommendation  follows ACR consensus guidelines: White Paper of the ACR Incidental Findings Committee II on Vascular Findings. J Am Coll Radiol 2013; 10:789-794. Aortic aneurysm NOS (ICD10-I71.9)  Nonvascular findings:  1. 3.5 cm spiculated mass in the right lower lobe primarily worrisome for bronchogenic carcinoma. Right bronchial adenopathy is present. There is also an anterior mediastinal mass measuring 2.7 cm, an atypical location to reflect solitary mediastinal nodal metastasis, more likely thymic. Recommend multi disciplinary thoracic oncology follow-up. 2. There also ground-glass opacities and nodules in the right upper lobe which could be inflammatory or neoplastic. 3. Polycystic kidneys. Solid enhancing or complex cystic lesions could be present on both sides, attention on follow-up staging scans. 4. Cholelithiasis.  EKG:  EKG is *** ordered today.  The ekg ordered today demonstrates ***  Recent Labs:  01/01/2020: Magnesium 1.9 06/24/2020: B Natriuretic Peptide 506.0 07/16/2020: ALT 12; BUN 21; Creatinine, Ser 1.00; Hemoglobin 8.1; Platelets 487; Potassium 3.3; Sodium 138  Recent Lipid Panel No results found for: CHOL, TRIG, HDL, CHOLHDL, VLDL, LDLCALC, LDLDIRECT  Physical Exam:    VS:  LMP  (LMP Unknown)     Wt Readings from Last 3 Encounters:  07/15/20 122 lb (55.3 kg)  06/30/20 126 lb (57.2 kg)  06/23/20 126 lb (57.2 kg)     GEN: *** Well nourished, well developed in no acute distress HEENT: Normal NECK: No JVD; No carotid bruits LYMPHATICS: No lymphadenopathy CARDIAC: ***RRR, no murmurs, rubs, gallops RESPIRATORY:  Clear to auscultation without rales, wheezing or rhonchi  ABDOMEN: Soft, non-tender, non-distended MUSCULOSKELETAL:  No edema; No deformity  SKIN: Warm and dry NEUROLOGIC:  Alert and oriented x 3 PSYCHIATRIC:  Normal affect   ASSESSMENT:    No diagnosis found. PLAN:    In order of problems listed above:  #CAD  #Ischemic CM:  #AAA measuring  4.6cm  #PAD:  CT with right axillobifemoral graft with occluded left limb. Left-sided hypogastric and lower extremity arteries reconstitute from numerous well-formed collaterals. Right iliac occlusion with reconstitution from the axillofemoral bypass.  #COPD on home O2:  #HLD:  Medication Adjustments/Labs and Tests Ordered: Current medicines are reviewed at length with the patient today.  Concerns regarding medicines are outlined above.  No orders of the defined types were placed in this encounter.  No orders of the defined types were placed in this encounter.   There are no Patient Instructions on file for this visit.   Signed, Freada Bergeron, MD  07/19/2020 6:20 PM    [place]

## 2020-07-19 NOTE — Telephone Encounter (Signed)
Deandra with Hospice called asking for prescription for nausea for this patient. We went over medications from ER and I was told that patient did not get Dexamethasone and that her Med director ordered her MS over the weekend. I see that prescription was printed and I asked her to check with patient/family to see if prescription is in papers given to patient at discharge.. Please advise on nausea medicine and do you want to escribe liquid dex to her pharmacy [facility] on [address] [facility] [place]?

## 2020-07-19 NOTE — Telephone Encounter (Signed)
[name] - please advise

## 2020-07-19 NOTE — Telephone Encounter (Signed)
I spoke with hospice nurse. Orders sent.

## 2020-07-19 NOTE — Progress Notes (Signed)
I spoke with patient's hospice nurse. Patient has had nausea without vomiting. She does not currently have anything to take for nausea.  We will send Rx for Zofran and Compazine as needed.  Patient is not taking the dexamethasone as recommended by Dr. Rogue Bussing for pain.  Currently they do not have this prescription at home.  We will resend Rx.  I suspect that dexamethasone might also help with her nausea and appetite.

## 2020-07-21 ENCOUNTER — Ambulatory Visit: Payer: Medicare Other | Admitting: Cardiology

## 2020-07-22 ENCOUNTER — Other Ambulatory Visit: Payer: Self-pay | Admitting: *Deleted

## 2020-07-22 ENCOUNTER — Encounter: Payer: Self-pay | Admitting: *Deleted

## 2020-07-25 ENCOUNTER — Other Ambulatory Visit: Payer: Self-pay | Admitting: Nurse Practitioner

## 2020-07-28 ENCOUNTER — Ambulatory Visit: Payer: Medicare Other | Admitting: Emergency Medicine

## 2020-07-30 ENCOUNTER — Telehealth: Payer: Self-pay | Admitting: *Deleted

## 2020-07-30 NOTE — Telephone Encounter (Signed)
-----   Message from Freddi Starr, MD sent at 07/28/2020  2:31 PM EDT ----- Regarding: RE: EBUS Did this procedure ever get done or what is the plan? ----- Message ----- From: Candee Furbish, MD Sent: 06/24/2020   6:06 PM EDT To: Lanier Clam, MD, Freddi Starr, MD, # Subject: EBUS                                           Please schedule the following with either MH, JD, or RB  Diagnosis: Lung Mass Procedure: EBUS  Anesthesia: general Do you need Fluro? no Priority: 1 Date: Late next week or week after. Alternate Date: See above  Time: Per MH/JD/RB discretion Location: [place] Does patient have OSA? no DM? no Or Latex allergy? no Medication Restriction: no Anticoagulate/Antiplatelet: Plavix, needs washout Pre-op Labs Ordered: CBC, CMP, PT/INR, PTT Imaging request: none  Please coordinate Pre-op COVID Testing

## 2020-07-30 NOTE — Progress Notes (Signed)
Can you refer to cardiology or if she is already establish she needs a visit with them. Echo was abnormal, EF 40% and Left ventricular hypertrophy.

## 2020-07-30 NOTE — Telephone Encounter (Signed)
In researching it looks like it was scheduled for 9/21 but there is a phone note on 9/20 from [facility] with Tracey Jackson (daughter) to inform her bronch for tomorrow had been canceled per Dr. Lamonte Sakai and anesthesia. Referral was placed for cardiac referral for coronary artery disease and risk evaluation for proceeding with Bronch in future. Daughter stated understanding. Nothing further needed at the this time.

## 2020-08-02 NOTE — Telephone Encounter (Signed)
Noted Dr. Erin Fulling aware  Nothing further needed at this time.

## 2020-08-10 ENCOUNTER — Other Ambulatory Visit: Payer: Self-pay

## 2020-08-16 ENCOUNTER — Telehealth: Payer: Self-pay | Admitting: Internal Medicine

## 2020-08-16 NOTE — Telephone Encounter (Signed)
On 10/29- I called patient's daughter offered my condolences.  Family very thankful for the call.

## 2020-08-16 DEATH — deceased

## 2020-08-18 ENCOUNTER — Ambulatory Visit: Payer: Medicare Other | Admitting: Nurse Practitioner

## 2021-10-10 IMAGING — DX DG CHEST 1V PORT
1 series · 1 of 1 positions shown · non-contrast
Comparison: None.

CLINICAL DATA: Dyspnea.  Bilateral leg swelling

EXAM:
PORTABLE CHEST 1 VIEW

[chest ap]
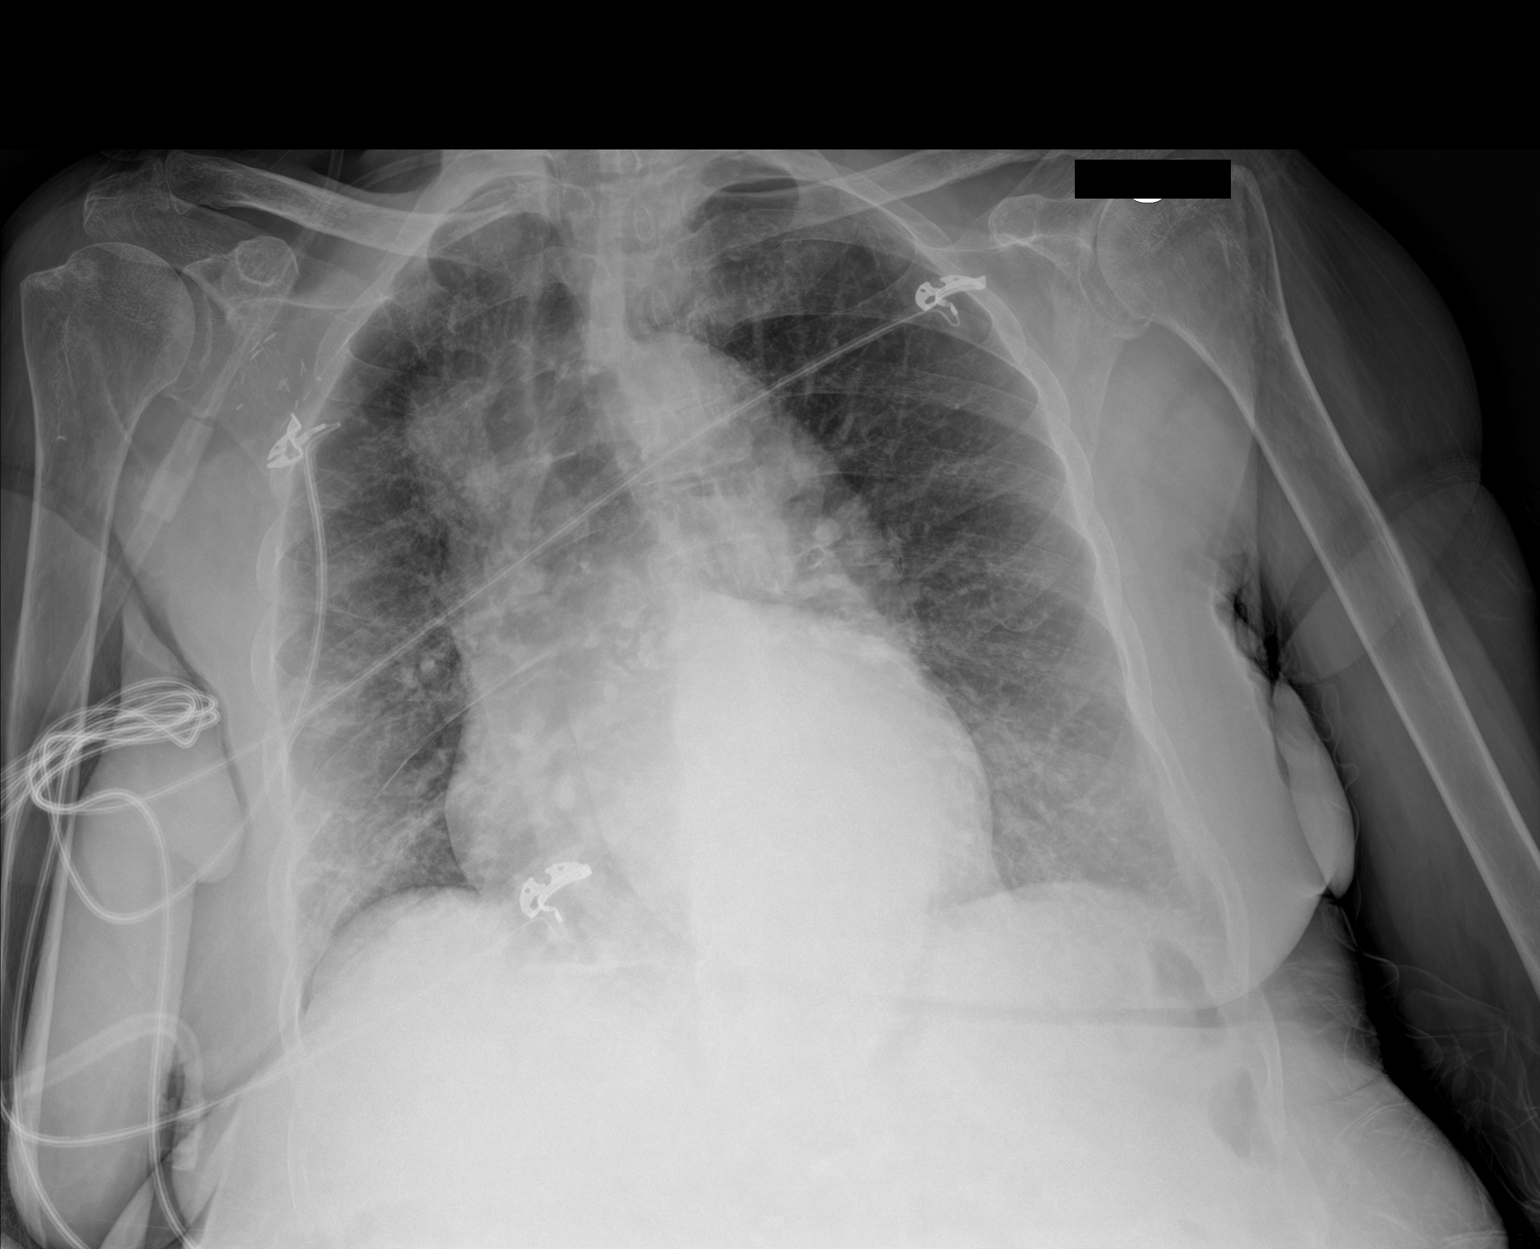

[1 of 1 positions shown; findings below may reference images not displayed]

FINDINGS: Cardiomegaly and aortic tortuosity. Prominent right
paratracheal/suprahilar density which may be from rotation but needs
further characterization. Borderline vascular congestion. No visible
effusion or pneumothorax
IMPRESSION: 1. Mild cardiomegaly and borderline vascular congestion
2. Right paratracheal/suprahilar density may be from patient
rotation, but when able recommend two-view chest x-ray.

## 2021-10-10 IMAGING — US US EXTREM LOW VENOUS
1 series · 13 of 24 positions shown · non-contrast
Comparison: None.
COMPARISON: None.

Addendum:
CLINICAL DATA: Pain and swelling had his

EXAM:
BILATERAL LOWER EXTREMITY VENOUS DOPPLER ULTRASOUND
TECHNIQUE: Gray-scale sonography with compression, as well as color and duplex
ultrasound, were performed to evaluate the deep venous system(s)
from the level of the common femoral vein through the popliteal and
proximal calf veins.

[Series 1: us venous img lower bilat (dvt) · portal-venous · 13 of 62 slices shown]
[im 1/62]
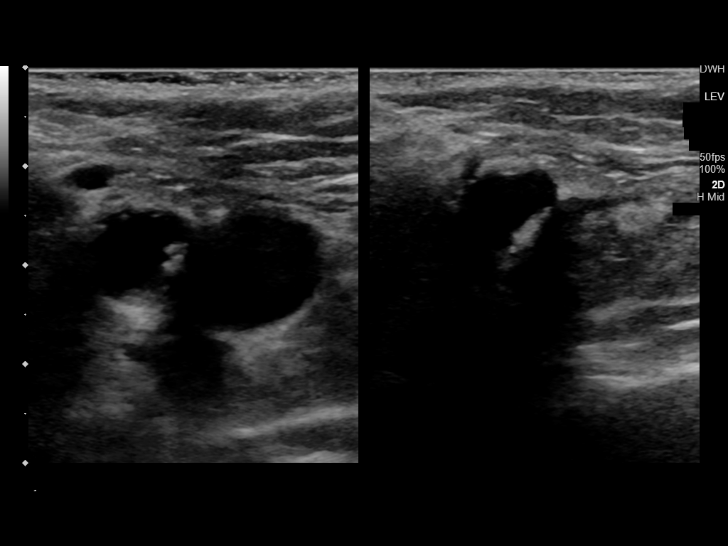
[im 6/62]
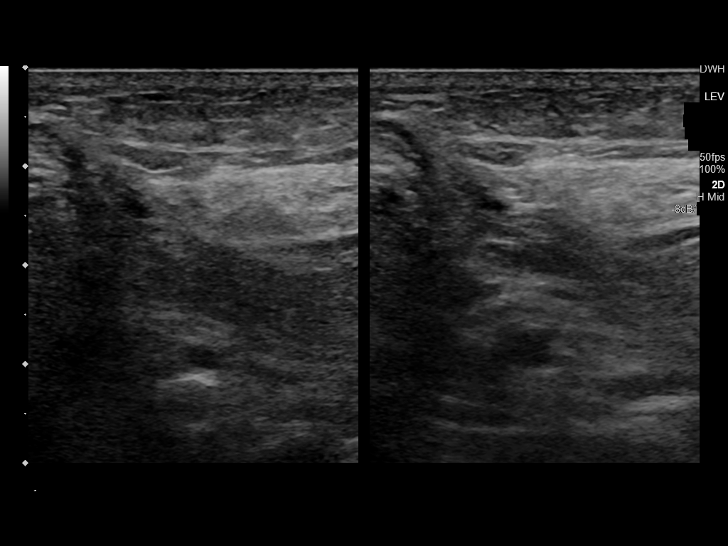
[im 11/62]
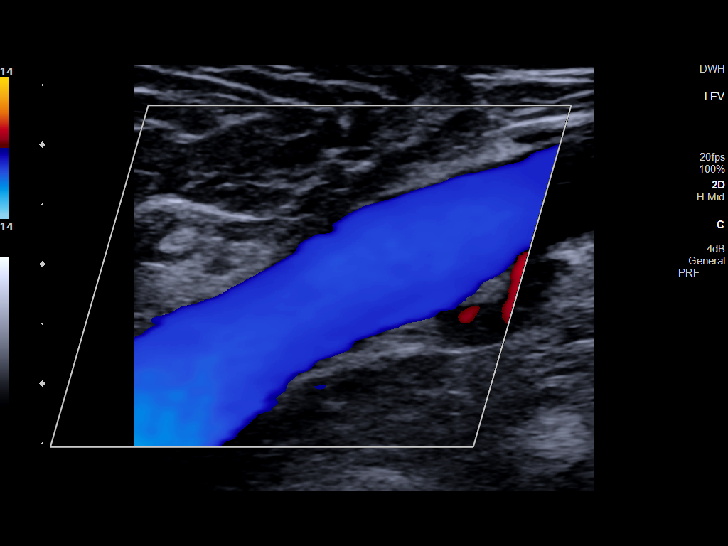
[im 16/62]
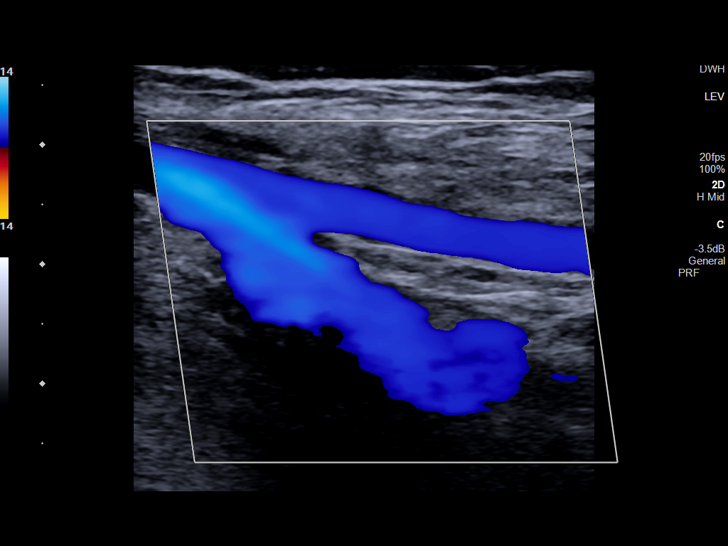
[im 22/62]
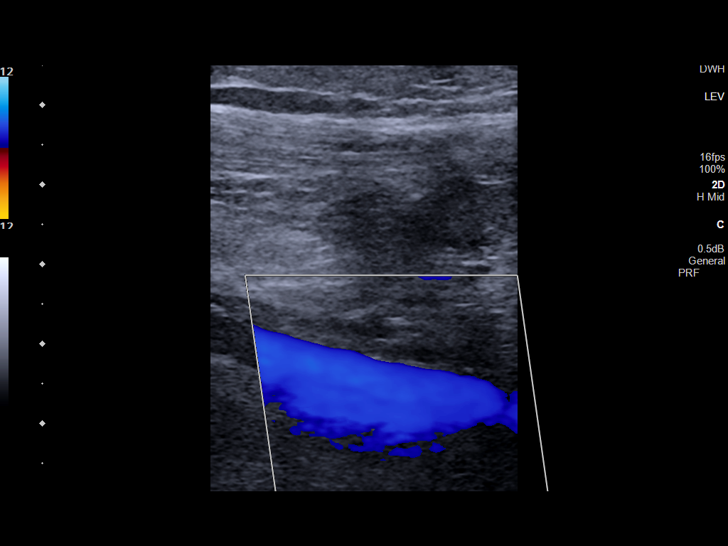
[im 27/62]
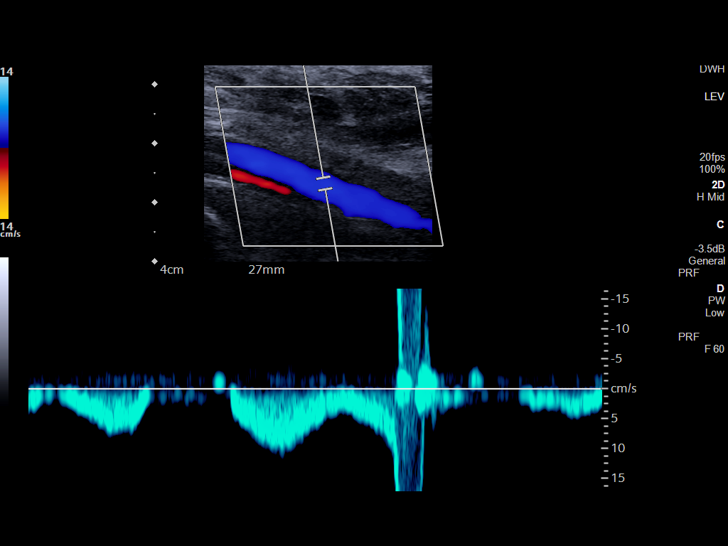
[im 32/62]
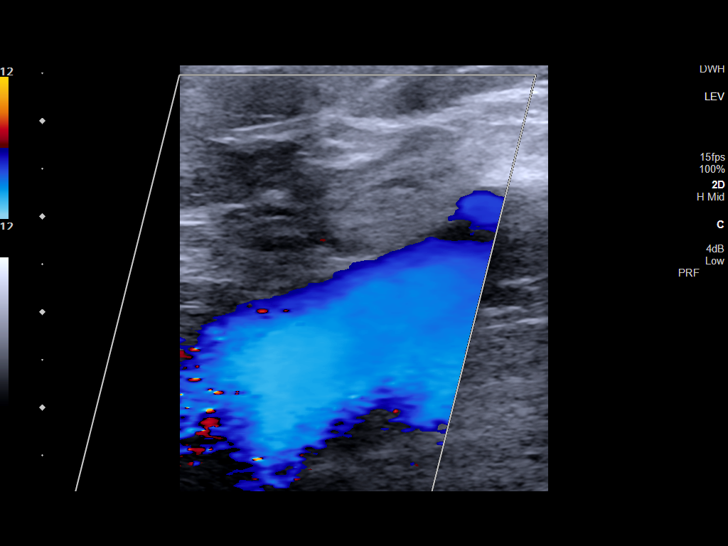
[im 35/62]
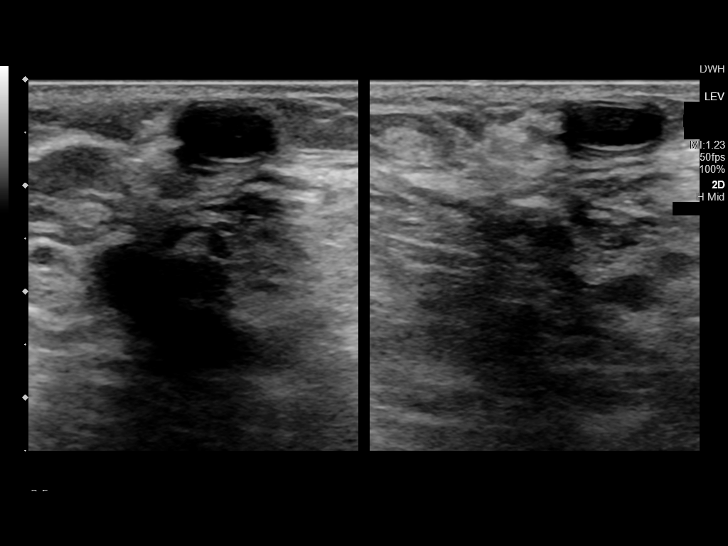
[im 40/62]
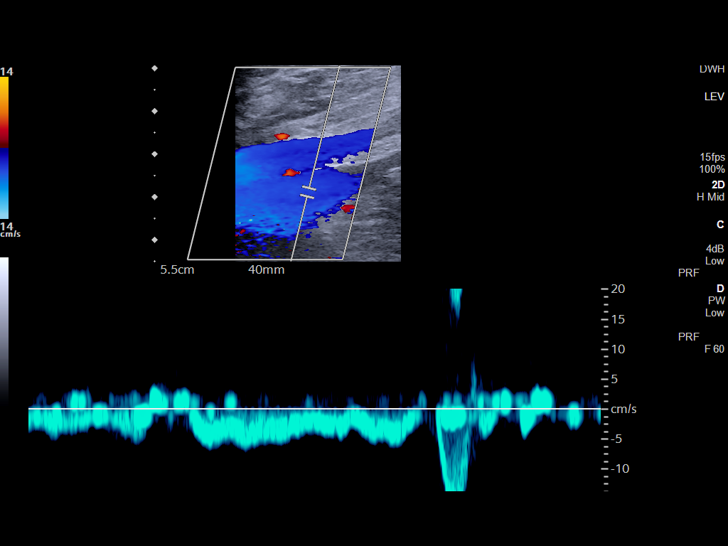
[im 46/62]
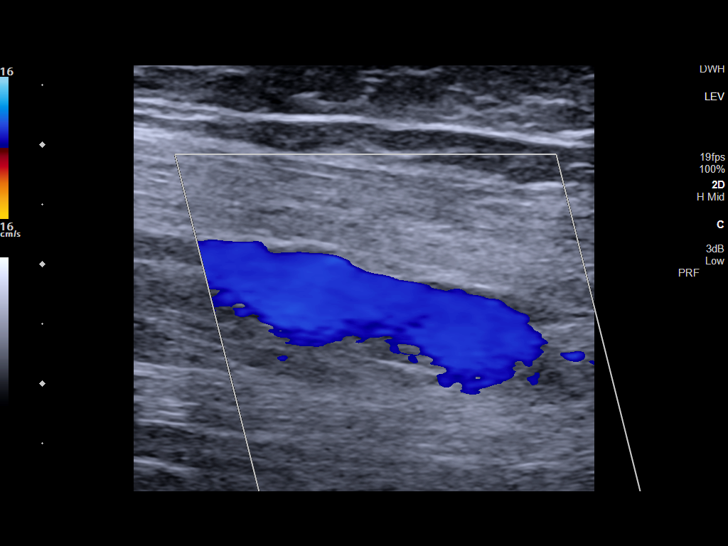
[im 51/62]
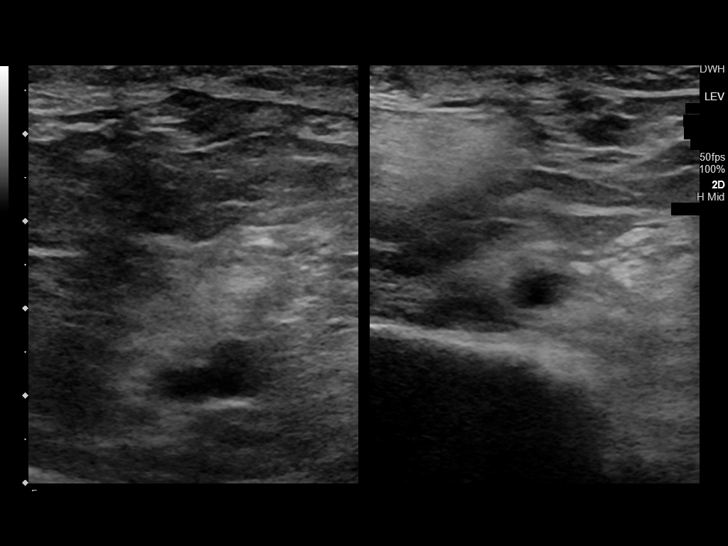
[im 56/62]
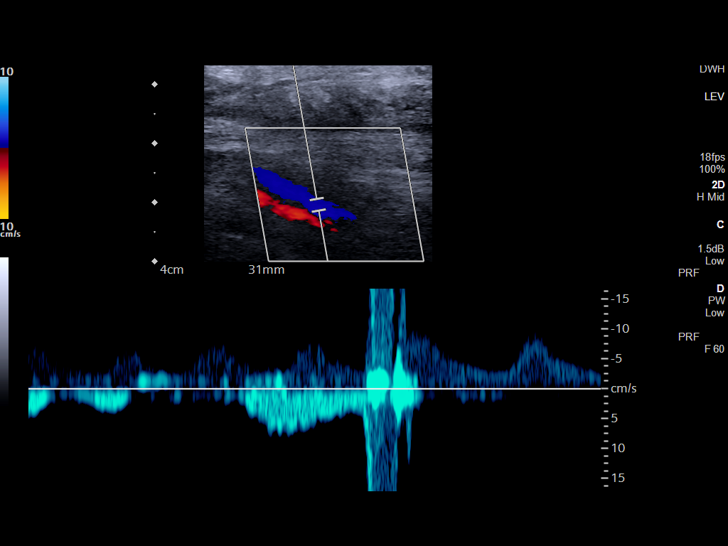
[im 62/62]
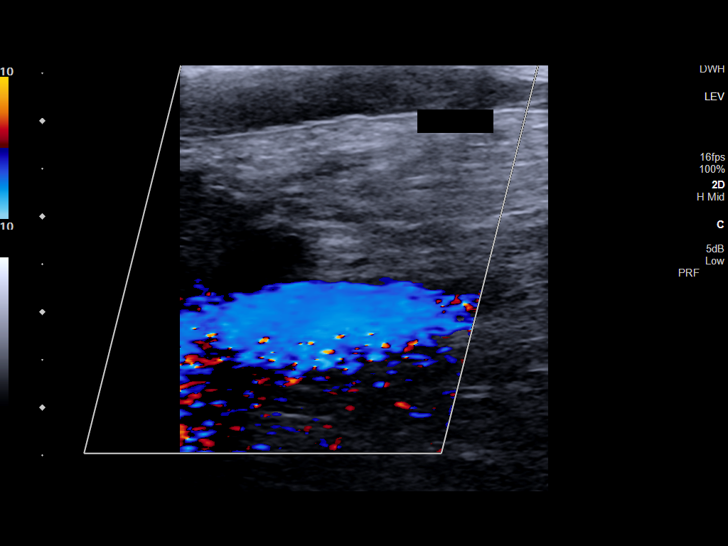

[13 of 24 positions shown; findings below may reference images not displayed]

FINDINGS: VENOUS

Normal compressibility of the common femoral, superficial femoral,
and popliteal veins, as well as the visualized calf veins.
Visualized portions of profunda femoral vein and great saphenous
vein unremarkable. No filling defects to suggest DVT on grayscale or
color Doppler imaging. Doppler waveforms show normal direction of
venous flow, normal respiratory plasticity and response to
augmentation.

OTHER

Incidental note is made of a left lower extremity vascular graft
coursing in the more superficial soft tissues which appears
completely occluded with hypoechoic thrombus with absent flow on
color Doppler imaging.

Limitations: none
IMPRESSION: No femoropopliteal DVT nor evidence of DVT within the visualized
calf veins. If clinical symptoms are inconsistent or if there are
persistent or worsening symptoms, further imaging (possibly
involving the iliac veins) may be warranted.

Incidental note made of a left lower extremity vascular graft,
possibly femoral bypass graft which appears completely occluded
without demonstrable color flow. Correlate with patient's surgical
history.

ADDENDUM:
These results were called by telephone at the time of interpretation
on 03/11/2020 at [DATE] to provider GIORGI JUMPER , who verbally
acknowledged these results.

*** End of Addendum ***
FINDINGS: VENOUS

Normal compressibility of the common femoral, superficial femoral,
and popliteal veins, as well as the visualized calf veins.
Visualized portions of profunda femoral vein and great saphenous
vein unremarkable. No filling defects to suggest DVT on grayscale or
color Doppler imaging. Doppler waveforms show normal direction of
venous flow, normal respiratory plasticity and response to
augmentation.

OTHER

Incidental note is made of a left lower extremity vascular graft
coursing in the more superficial soft tissues which appears
completely occluded with hypoechoic thrombus with absent flow on
color Doppler imaging.

Limitations: none
IMPRESSION: No femoropopliteal DVT nor evidence of DVT within the visualized
calf veins. If clinical symptoms are inconsistent or if there are
persistent or worsening symptoms, further imaging (possibly
involving the iliac veins) may be warranted.

Incidental note made of a left lower extremity vascular graft,
possibly femoral bypass graft which appears completely occluded
without demonstrable color flow. Correlate with patient's surgical
history.

## 2022-05-05 IMAGING — CT CT ANGIO CHEST
2 of 6 series · 17 of 36 positions shown · IV contrast (omnipaque)
Comparison: 03/11/2020

CLINICAL DATA: Shortness of breath

EXAM:
CT ANGIOGRAPHY CHEST WITH CONTRAST
TECHNIQUE: Multidetector CT imaging of the chest was performed using the
standard protocol during bolus administration of intravenous
contrast. Multiplanar CT image reconstructions and MIPs were
obtained to evaluate the vascular anatomy.
CONTRAST:  50mL OMNIPAQUE IOHEXOL 350 MG/ML SOLN

[Series 7: pe thins · axial · 0.65mm/px · z∈[-504,-225]mm · 16 of 444 slices shown]
[im 23/444  lung]
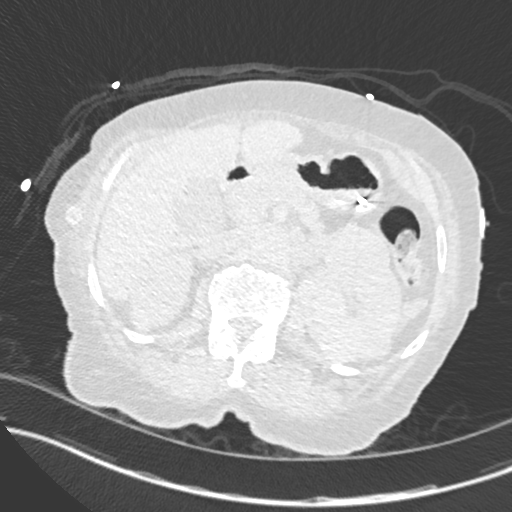
[im 45/444  mediastinal]
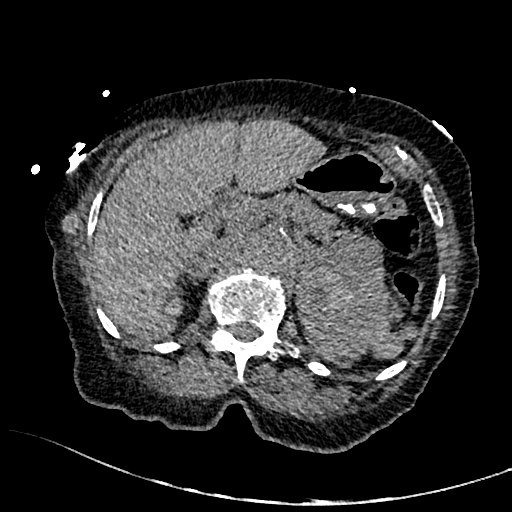
[im 67/444  lung]
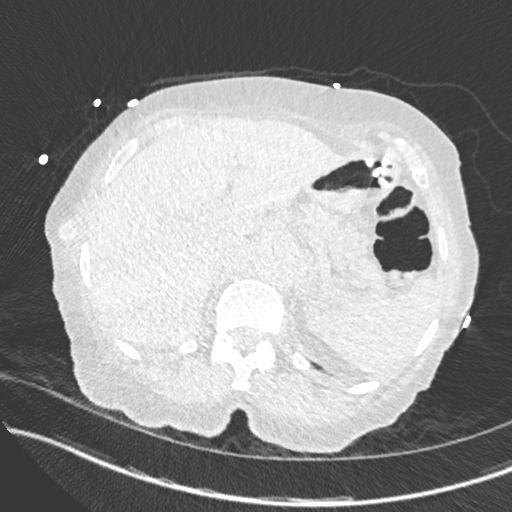
[im 111/444  mediastinal]
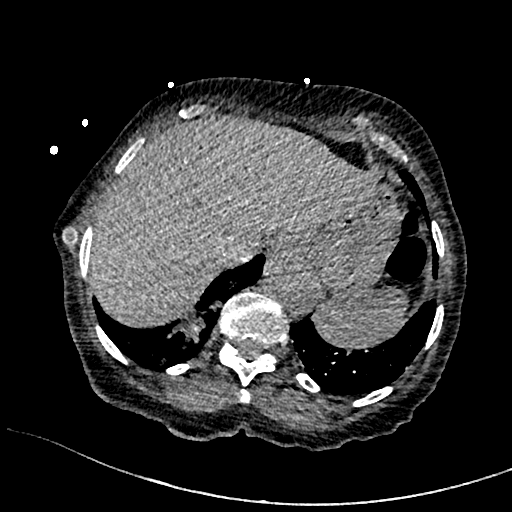
[im 133/444  lung]
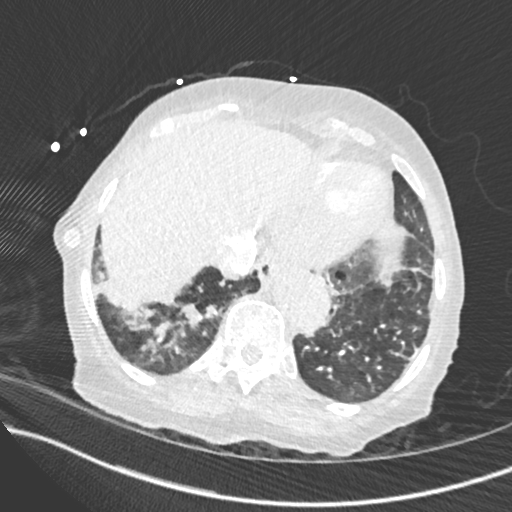
[im 156/444  mediastinal]
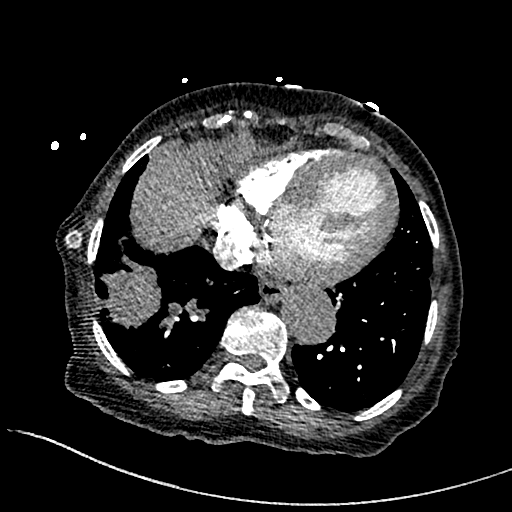
[im 178/444  lung]
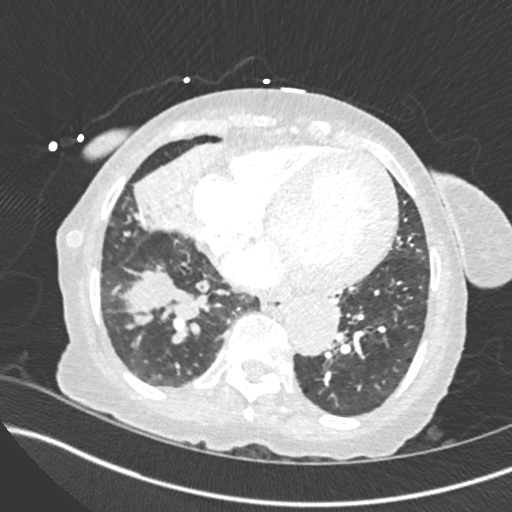
[im 200/444  mediastinal]
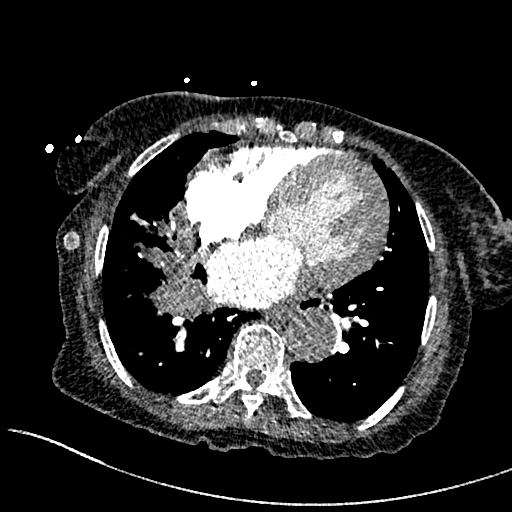
[im 244/444  lung]
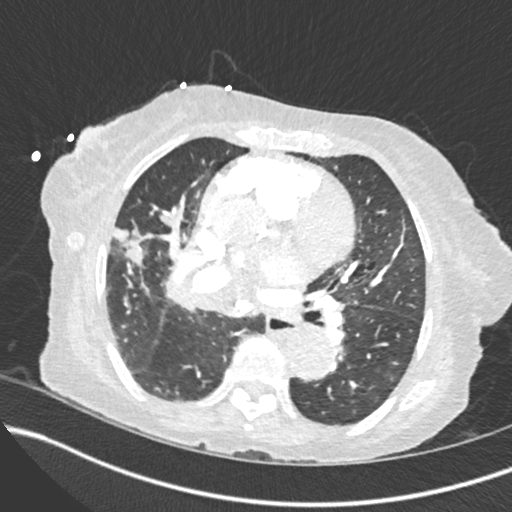
[im 266/444  mediastinal]
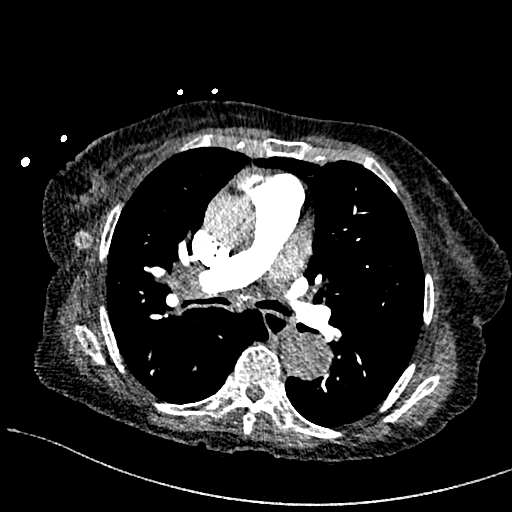
[im 288/444  lung]
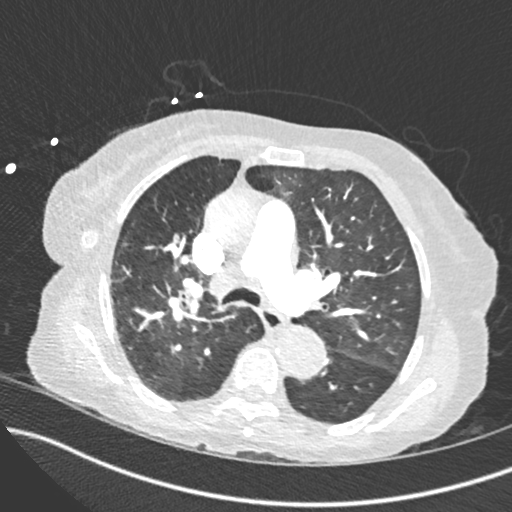
[im 311/444  mediastinal]
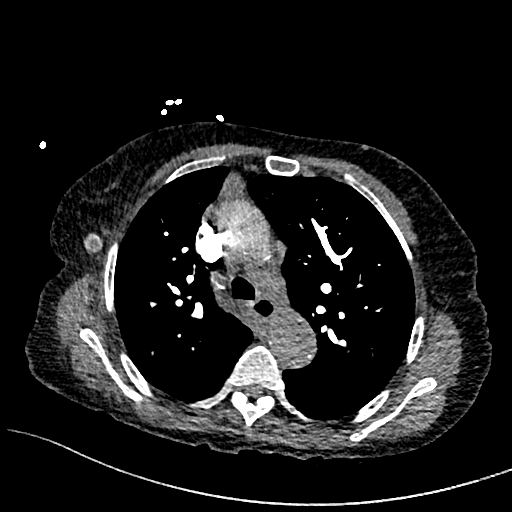
[im 333/444  lung]
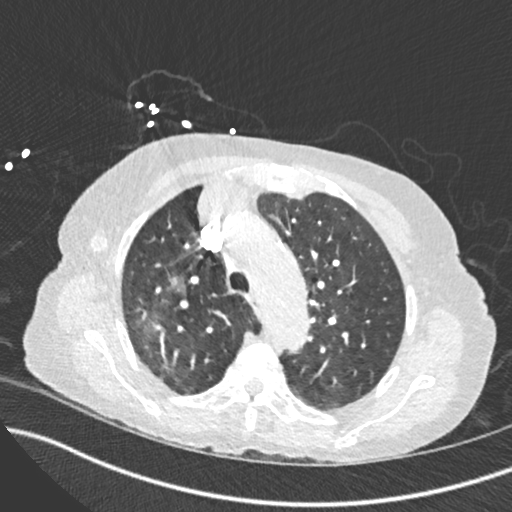
[im 377/444  mediastinal]
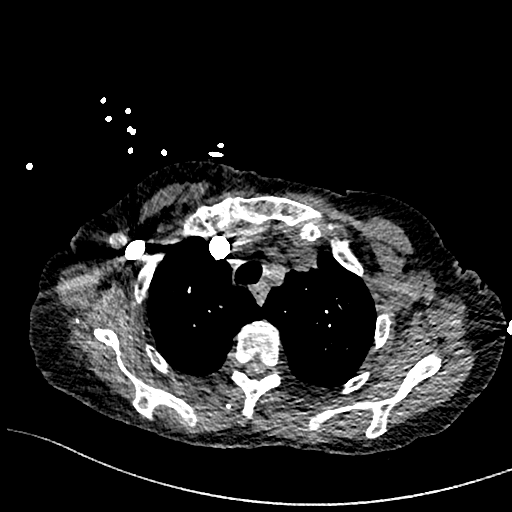
[im 399/444  lung]
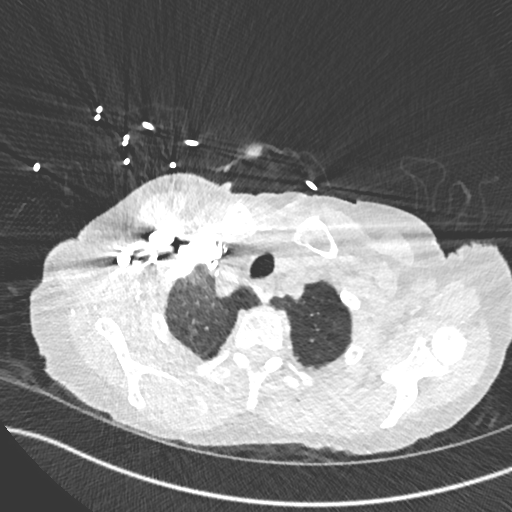
[im 421/444  mediastinal]
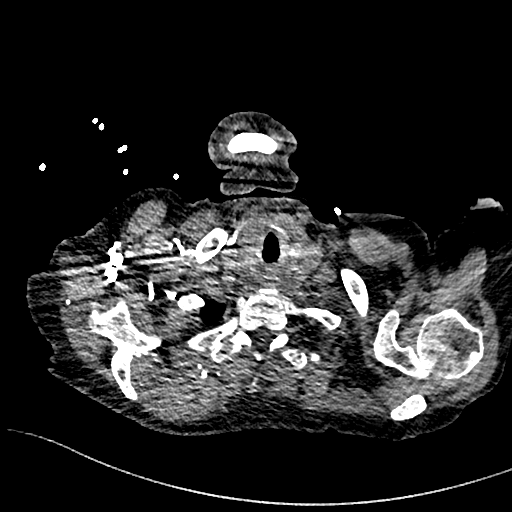

[Series 8: pe 2mm cor · coronal · 0.61mm/px · 1 of 151 slices shown]
[im 76/151  mediastinal]
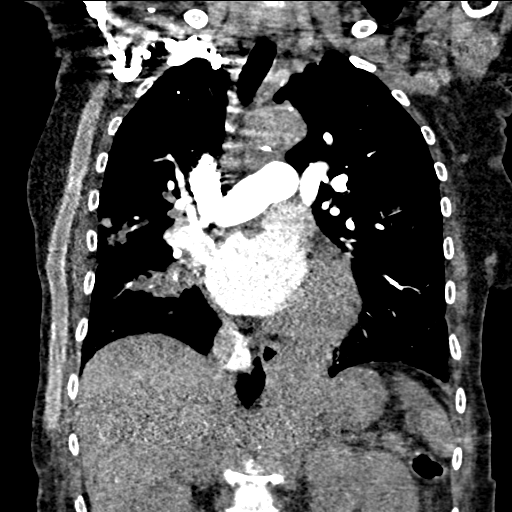

[17 of 36 positions shown; findings below may reference images not displayed]

FINDINGS: Cardiovascular: Heart is mildly enlarged. Thoracic aorta
demonstrates atherosclerotic calcifications without aneurysmal
dilatation. Pulmonary artery shows a normal branching pattern. No
filling defect to suggest pulmonary embolus is noted although some
attenuation of the lower lobe branches on the right is noted
secondary to the underlying mass lesion and hilar adenopathy.
Axillary bypass graft is noted on the right although it is not well
opacified due to timing of the contrast bolus.

Mediastinum/Nodes: Thoracic inlet is within normal limits. Scattered
calcified hilar and mediastinal nodes are noted consistent with
prior granulomatous disease. Additionally there is central right
hilar adenopathy measuring 2.3 cm in greatest dimension. Infrahilar
mass density is noted which may represent a combination of neoplasm
and hilar adenopathy.

Lungs/Pleura: The known right lower lobe mass lesion is again
identified and measures 3.7 cm. This is relatively stable from the
prior exam although new soft tissue density extending from the mass
centrally towards the hila is noted consistent with progression of
knee plastic mass. Additionally scattered nodules are seen within
the right upper lobe as well as consolidation in the middle lobe.
Bronchial plugging is noted in the right lower lobe likely related
to tumor ingrowth which is new from the prior exam. Some mass effect
upon the right atrium is noted best seen on images 256 through 269
of series 7. This may simply represent consolidated lung although
the possibility of ingrowth into the right atrium deserves
consideration. Correlation with echocardiography may be helpful.

Upper Abdomen: Stable cystic changes in the kidneys are noted
bilaterally but incompletely evaluated due to timing of contrast
bolus. Cholelithiasis is noted. Remainder of the upper abdomen
appears within normal limits.

Musculoskeletal: Degenerative changes of the thoracic spine are
noted.

Review of the MIP images confirms the above findings.
IMPRESSION: No evidence of pulmonary emboli.

Significant progression of the known right lower lobe mass with
extension of soft tissue neoplasm to the level of the right hilum
with significant adenopathy and neoplasm involving the region of the
right hilum. This causes attenuation of the pulmonary arterial
branches in the right lower lobe although no emboli are seen. Tumor
ingrowth into the lower lobe bronchi is noted as well.

Changes suspicious for in growth of tumor into the lateral wall of
the right atrium although difficult to evaluate on this exam.
Echocardiography may be helpful as clinically indicated.

Nodular changes in the right upper lobe are noted suspicious for
neoplastic involvement. Additionally collapse of the right middle
lobe is noted likely related to central bronchial occlusion.

Stable changes in the kidneys consistent with cystic change.

Aortic Atherosclerosis ([id number].G).
